# Patient Record
Sex: Female | Born: 1990 | Race: White | Hispanic: No | Marital: Single | Smoking: Former smoker
Health system: Southern US, Community
[De-identification: ages and names within clinical notes are randomized; demographics above are authoritative.]

## PROBLEM LIST (undated history)

## (undated) ENCOUNTER — Inpatient Hospital Stay: Payer: Self-pay

## (undated) DIAGNOSIS — Q428 Congenital absence, atresia and stenosis of other parts of large intestine: Secondary | ICD-10-CM

## (undated) DIAGNOSIS — K219 Gastro-esophageal reflux disease without esophagitis: Secondary | ICD-10-CM

## (undated) DIAGNOSIS — R87629 Unspecified abnormal cytological findings in specimens from vagina: Secondary | ICD-10-CM

## (undated) DIAGNOSIS — F419 Anxiety disorder, unspecified: Secondary | ICD-10-CM

## (undated) DIAGNOSIS — Q423 Congenital absence, atresia and stenosis of anus without fistula: Secondary | ICD-10-CM

## (undated) DIAGNOSIS — J45909 Unspecified asthma, uncomplicated: Secondary | ICD-10-CM

## (undated) DIAGNOSIS — Q421 Congenital absence, atresia and stenosis of rectum without fistula: Secondary | ICD-10-CM

## (undated) HISTORY — PX: ABDOMINAL SURGERY: SHX537

## (undated) HISTORY — PX: OTHER SURGICAL HISTORY: SHX169

---
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
## 2011-02-02 ENCOUNTER — Ambulatory Visit: Payer: Self-pay | Admitting: Gastroenterology

## 2011-02-08 ENCOUNTER — Ambulatory Visit: Payer: Self-pay | Admitting: Gastroenterology

## 2011-03-11 DIAGNOSIS — I059 Rheumatic mitral valve disease, unspecified: Secondary | ICD-10-CM | POA: Insufficient documentation

## 2011-03-11 DIAGNOSIS — F17200 Nicotine dependence, unspecified, uncomplicated: Secondary | ICD-10-CM | POA: Insufficient documentation

## 2011-04-26 ENCOUNTER — Ambulatory Visit: Payer: Self-pay | Admitting: Gastroenterology

## 2011-05-08 ENCOUNTER — Ambulatory Visit: Payer: Self-pay

## 2011-07-24 ENCOUNTER — Emergency Department: Payer: Self-pay | Admitting: *Deleted

## 2011-10-03 DIAGNOSIS — R7989 Other specified abnormal findings of blood chemistry: Secondary | ICD-10-CM | POA: Insufficient documentation

## 2011-10-11 ENCOUNTER — Ambulatory Visit: Payer: Self-pay | Admitting: Gastroenterology

## 2011-10-11 LAB — HCG, QUANTITATIVE, PREGNANCY: Beta Hcg, Quant.: <1 m[IU]/mL — ABNORMAL LOW

## 2011-10-11 LAB — PROTIME-INR
INR: 0.9
Prothrombin Time: 12.9 secs (ref 11.5–14.7)

## 2011-10-11 LAB — APTT: Activated PTT: 27.7 secs (ref 23.6–35.9)

## 2011-10-11 LAB — PLATELET COUNT: Platelet: 213 10*3/uL (ref 150–440)

## 2011-10-13 LAB — PATHOLOGY REPORT

## 2011-12-20 DIAGNOSIS — Q429 Congenital absence, atresia and stenosis of large intestine, part unspecified: Secondary | ICD-10-CM | POA: Insufficient documentation

## 2012-04-03 ENCOUNTER — Ambulatory Visit: Payer: Self-pay | Admitting: Gastroenterology

## 2012-05-22 ENCOUNTER — Ambulatory Visit: Payer: Self-pay | Admitting: Internal Medicine

## 2012-05-22 LAB — RAPID STREP-A WITH REFLX: Micro Text Report: NEGATIVE

## 2012-05-24 LAB — BETA STREP CULTURE(ARMC)

## 2012-08-21 ENCOUNTER — Other Ambulatory Visit: Payer: Self-pay | Admitting: Gastroenterology

## 2012-08-21 LAB — BASIC METABOLIC PANEL
Anion Gap: 3 — ABNORMAL LOW (ref 7–16)
BUN: 11 mg/dL (ref 7–18)
Calcium, Total: 9.2 mg/dL (ref 8.5–10.1)
Chloride: 110 mmol/L — ABNORMAL HIGH (ref 98–107)
Co2: 27 mmol/L (ref 21–32)
Creatinine: 0.92 mg/dL (ref 0.60–1.30)
EGFR (African American): >60
EGFR (Non-African Amer.): >60
Glucose: 81 mg/dL (ref 65–99)
Osmolality: 278 (ref 275–301)
Potassium: 4 mmol/L (ref 3.5–5.1)
Sodium: 140 mmol/L (ref 136–145)

## 2012-08-21 LAB — CBC WITH DIFFERENTIAL/PLATELET
Basophil #: 0 10*3/uL (ref 0.0–0.1)
Basophil %: 0.7 %
Eosinophil #: 0.1 10*3/uL (ref 0.0–0.7)
Eosinophil %: 1 %
HCT: 43.6 % (ref 35.0–47.0)
HGB: 15.6 g/dL (ref 12.0–16.0)
Lymphocyte #: 2 10*3/uL (ref 1.0–3.6)
Lymphocyte %: 30.9 %
MCH: 32.2 pg (ref 26.0–34.0)
MCHC: 35.7 g/dL (ref 32.0–36.0)
MCV: 90 fL (ref 80–100)
Monocyte #: 0.5 x10 3/mm (ref 0.2–0.9)
Monocyte %: 7.3 %
Neutrophil #: 4 10*3/uL (ref 1.4–6.5)
Neutrophil %: 60.1 %
Platelet: 253 10*3/uL (ref 150–440)
RBC: 4.83 10*6/uL (ref 3.80–5.20)
RDW: 12 % (ref 11.5–14.5)
WBC: 6.6 10*3/uL (ref 3.6–11.0)

## 2012-08-21 LAB — HEPATIC FUNCTION PANEL A (ARMC)
Albumin: 4 g/dL (ref 3.4–5.0)
Alkaline Phosphatase: 113 U/L (ref 50–136)
Bilirubin, Direct: 0.1 mg/dL (ref 0.00–0.20)
Bilirubin,Total: 0.8 mg/dL (ref 0.2–1.0)
SGOT(AST): 72 U/L — ABNORMAL HIGH (ref 15–37)
SGPT (ALT): 133 U/L — ABNORMAL HIGH (ref 12–78)
Total Protein: 7.2 g/dL (ref 6.4–8.2)

## 2012-08-21 LAB — PROTIME-INR
INR: 0.8
Prothrombin Time: 11.5 secs (ref 11.5–14.7)

## 2012-08-21 LAB — PREGNANCY, URINE: Pregnancy Test, Urine: NEGATIVE m[IU]/mL

## 2012-08-21 LAB — CLOSTRIDIUM DIFFICILE BY PCR

## 2012-08-21 LAB — WBCS, STOOL

## 2012-08-23 LAB — STOOL CULTURE

## 2013-01-17 ENCOUNTER — Ambulatory Visit: Payer: Self-pay | Admitting: Otolaryngology

## 2013-04-19 ENCOUNTER — Ambulatory Visit: Payer: Self-pay | Admitting: Family Medicine

## 2013-09-22 ENCOUNTER — Ambulatory Visit: Payer: Self-pay | Admitting: Family Medicine

## 2013-09-22 LAB — URINALYSIS, COMPLETE
Bacteria: NEGATIVE
Bilirubin,UR: NEGATIVE
Glucose,UR: NEGATIVE mg/dL (ref 0–75)
Ketone: NEGATIVE
Leukocyte Esterase: NEGATIVE
Nitrite: NEGATIVE
Ph: 6 (ref 4.5–8.0)
Specific Gravity: 1.025 (ref 1.003–1.030)

## 2013-09-22 LAB — PREGNANCY, URINE: Pregnancy Test, Urine: NEGATIVE m[IU]/mL

## 2013-09-22 LAB — WET PREP, GENITAL

## 2013-09-22 LAB — GC/CHLAMYDIA PROBE AMP

## 2013-10-26 ENCOUNTER — Ambulatory Visit: Payer: Self-pay | Admitting: Physician Assistant

## 2013-10-26 LAB — PREGNANCY, URINE: Pregnancy Test, Urine: NEGATIVE m[IU]/mL

## 2013-11-13 DIAGNOSIS — F32A Depression, unspecified: Secondary | ICD-10-CM | POA: Insufficient documentation

## 2013-11-13 DIAGNOSIS — F329 Major depressive disorder, single episode, unspecified: Secondary | ICD-10-CM | POA: Insufficient documentation

## 2014-01-13 ENCOUNTER — Emergency Department: Payer: Self-pay | Admitting: Emergency Medicine

## 2014-02-23 ENCOUNTER — Emergency Department: Payer: Self-pay | Admitting: Emergency Medicine

## 2014-02-23 LAB — URINALYSIS, COMPLETE
Bacteria: NONE SEEN
Bilirubin,UR: NEGATIVE
Blood: NEGATIVE
Glucose,UR: NEGATIVE mg/dL (ref 0–75)
Ketone: NEGATIVE
Leukocyte Esterase: NEGATIVE
Nitrite: NEGATIVE
Ph: 5 (ref 4.5–8.0)
Protein: NEGATIVE
RBC,UR: 1 /HPF (ref 0–5)
Specific Gravity: 1.021 (ref 1.003–1.030)
Squamous Epithelial: 5
WBC UR: 1 /HPF (ref 0–5)

## 2014-02-23 LAB — CBC WITH DIFFERENTIAL/PLATELET
Basophil #: 0 10*3/uL (ref 0.0–0.1)
Basophil %: 0.5 %
Eosinophil #: 0.1 10*3/uL (ref 0.0–0.7)
Eosinophil %: 1.4 %
HCT: 45 % (ref 35.0–47.0)
HGB: 15 g/dL (ref 12.0–16.0)
Lymphocyte #: 2.3 10*3/uL (ref 1.0–3.6)
Lymphocyte %: 29.7 %
MCH: 30.7 pg (ref 26.0–34.0)
MCHC: 33.3 g/dL (ref 32.0–36.0)
MCV: 92 fL (ref 80–100)
Monocyte #: 0.6 x10 3/mm (ref 0.2–0.9)
Monocyte %: 7.7 %
Neutrophil #: 4.7 10*3/uL (ref 1.4–6.5)
Neutrophil %: 60.7 %
Platelet: 201 10*3/uL (ref 150–440)
RBC: 4.87 10*6/uL (ref 3.80–5.20)
RDW: 12.2 % (ref 11.5–14.5)
WBC: 7.8 10*3/uL (ref 3.6–11.0)

## 2014-02-24 LAB — PREGNANCY, URINE: Pregnancy Test, Urine: NEGATIVE m[IU]/mL

## 2014-05-31 ENCOUNTER — Emergency Department: Payer: Self-pay | Admitting: Emergency Medicine

## 2014-06-05 ENCOUNTER — Emergency Department: Payer: Self-pay | Admitting: Emergency Medicine

## 2014-06-05 LAB — URINALYSIS, COMPLETE
Bilirubin,UR: NEGATIVE
Glucose,UR: NEGATIVE mg/dL (ref 0–75)
Ketone: NEGATIVE
Nitrite: NEGATIVE
Ph: 5 (ref 4.5–8.0)
Protein: NEGATIVE
RBC,UR: 14 /HPF (ref 0–5)
Specific Gravity: 1.009 (ref 1.003–1.030)
Squamous Epithelial: NONE SEEN
WBC UR: 52 /HPF (ref 0–5)

## 2014-06-05 LAB — COMPREHENSIVE METABOLIC PANEL
Albumin: 3.9 g/dL (ref 3.4–5.0)
Alkaline Phosphatase: 83 U/L
Anion Gap: 6 — ABNORMAL LOW (ref 7–16)
BUN: 11 mg/dL (ref 7–18)
Bilirubin,Total: 0.4 mg/dL (ref 0.2–1.0)
Calcium, Total: 9.1 mg/dL (ref 8.5–10.1)
Chloride: 108 mmol/L — ABNORMAL HIGH (ref 98–107)
Co2: 25 mmol/L (ref 21–32)
Creatinine: 0.96 mg/dL (ref 0.60–1.30)
EGFR (African American): >60
EGFR (Non-African Amer.): >60
Glucose: 81 mg/dL (ref 65–99)
Osmolality: 276 (ref 275–301)
Potassium: 3.9 mmol/L (ref 3.5–5.1)
SGOT(AST): 18 U/L (ref 15–37)
SGPT (ALT): 18 U/L
Sodium: 139 mmol/L (ref 136–145)
Total Protein: 7.2 g/dL (ref 6.4–8.2)

## 2014-06-05 LAB — CBC
HCT: 45.9 % (ref 35.0–47.0)
HGB: 15.4 g/dL (ref 12.0–16.0)
MCH: 30.3 pg (ref 26.0–34.0)
MCHC: 33.5 g/dL (ref 32.0–36.0)
MCV: 91 fL (ref 80–100)
Platelet: 225 10*3/uL (ref 150–440)
RBC: 5.07 10*6/uL (ref 3.80–5.20)
RDW: 12.2 % (ref 11.5–14.5)
WBC: 12.7 10*3/uL — ABNORMAL HIGH (ref 3.6–11.0)

## 2014-06-07 LAB — URINE CULTURE

## 2014-08-10 ENCOUNTER — Emergency Department: Payer: Self-pay | Admitting: Student

## 2014-08-10 LAB — BASIC METABOLIC PANEL
Anion Gap: 6 — ABNORMAL LOW (ref 7–16)
BUN: 16 mg/dL (ref 7–18)
Calcium, Total: 9 mg/dL (ref 8.5–10.1)
Chloride: 107 mmol/L (ref 98–107)
Co2: 29 mmol/L (ref 21–32)
Creatinine: 1.31 mg/dL — ABNORMAL HIGH (ref 0.60–1.30)
EGFR (African American): >60
EGFR (Non-African Amer.): 54 — ABNORMAL LOW
Glucose: 62 mg/dL — ABNORMAL LOW (ref 65–99)
Osmolality: 282 (ref 275–301)
Potassium: 4 mmol/L (ref 3.5–5.1)
Sodium: 142 mmol/L (ref 136–145)

## 2014-08-10 LAB — CBC
HCT: 45 % (ref 35.0–47.0)
HGB: 15.3 g/dL (ref 12.0–16.0)
MCH: 30.8 pg (ref 26.0–34.0)
MCHC: 34 g/dL (ref 32.0–36.0)
MCV: 91 fL (ref 80–100)
Platelet: 237 10*3/uL (ref 150–440)
RBC: 4.96 10*6/uL (ref 3.80–5.20)
RDW: 12.3 % (ref 11.5–14.5)
WBC: 10.5 10*3/uL (ref 3.6–11.0)

## 2014-10-25 ENCOUNTER — Ambulatory Visit: Payer: Self-pay

## 2014-11-27 ENCOUNTER — Emergency Department: Payer: Self-pay | Admitting: Emergency Medicine

## 2014-11-27 LAB — CBC
HCT: 43.2 % (ref 35.0–47.0)
HGB: 14.8 g/dL (ref 12.0–16.0)
MCH: 31 pg (ref 26.0–34.0)
MCHC: 34.3 g/dL (ref 32.0–36.0)
MCV: 90 fL (ref 80–100)
Platelet: 206 10*3/uL (ref 150–440)
RBC: 4.78 10*6/uL (ref 3.80–5.20)
RDW: 12.4 % (ref 11.5–14.5)
WBC: 7.9 10*3/uL (ref 3.6–11.0)

## 2014-11-27 LAB — COMPREHENSIVE METABOLIC PANEL
Albumin: 4.2 g/dL
Alkaline Phosphatase: 68 U/L
Anion Gap: 8 (ref 7–16)
BUN: 10 mg/dL
Bilirubin,Total: 0.8 mg/dL
Calcium, Total: 9.1 mg/dL
Chloride: 108 mmol/L
Co2: 26 mmol/L
Creatinine: 0.9 mg/dL
EGFR (African American): >60
EGFR (Non-African Amer.): >60
Glucose: 69 mg/dL
Potassium: 3.3 mmol/L — ABNORMAL LOW
SGOT(AST): 19 U/L
SGPT (ALT): 14 U/L
Sodium: 142 mmol/L
Total Protein: 6.8 g/dL

## 2014-11-27 LAB — URINALYSIS, COMPLETE
Bilirubin,UR: NEGATIVE
Glucose,UR: NEGATIVE mg/dL (ref 0–75)
Ketone: NEGATIVE
Nitrite: NEGATIVE
Ph: 7 (ref 4.5–8.0)
Protein: 100
RBC,UR: 1954 /HPF (ref 0–5)
Specific Gravity: 1.018 (ref 1.003–1.030)
Squamous Epithelial: 8
WBC UR: 212 /HPF (ref 0–5)

## 2014-11-27 LAB — WET PREP, GENITAL

## 2014-11-29 LAB — URINE CULTURE

## 2015-03-29 ENCOUNTER — Emergency Department: Payer: No Typology Code available for payment source

## 2015-03-29 ENCOUNTER — Emergency Department
Admission: EM | Admit: 2015-03-29 | Discharge: 2015-03-29 | Disposition: A | Discharge status: Home / Self Care | Payer: No Typology Code available for payment source | Attending: Emergency Medicine | Admitting: Emergency Medicine

## 2015-03-29 ENCOUNTER — Encounter: Payer: Self-pay | Admitting: Emergency Medicine

## 2015-03-29 DIAGNOSIS — Z720 Tobacco use: Secondary | ICD-10-CM | POA: Insufficient documentation

## 2015-03-29 DIAGNOSIS — S63613A Unspecified sprain of left middle finger, initial encounter: Secondary | ICD-10-CM | POA: Diagnosis not present

## 2015-03-29 DIAGNOSIS — S299XXA Unspecified injury of thorax, initial encounter: Secondary | ICD-10-CM | POA: Insufficient documentation

## 2015-03-29 DIAGNOSIS — S6992XA Unspecified injury of left wrist, hand and finger(s), initial encounter: Secondary | ICD-10-CM | POA: Diagnosis present

## 2015-03-29 DIAGNOSIS — S3992XA Unspecified injury of lower back, initial encounter: Secondary | ICD-10-CM | POA: Insufficient documentation

## 2015-03-29 DIAGNOSIS — S63619A Unspecified sprain of unspecified finger, initial encounter: Secondary | ICD-10-CM

## 2015-03-29 DIAGNOSIS — Y998 Other external cause status: Secondary | ICD-10-CM | POA: Insufficient documentation

## 2015-03-29 DIAGNOSIS — Z3202 Encounter for pregnancy test, result negative: Secondary | ICD-10-CM | POA: Diagnosis not present

## 2015-03-29 DIAGNOSIS — Y9389 Activity, other specified: Secondary | ICD-10-CM | POA: Diagnosis not present

## 2015-03-29 DIAGNOSIS — Y92410 Unspecified street and highway as the place of occurrence of the external cause: Secondary | ICD-10-CM | POA: Insufficient documentation

## 2015-03-29 DIAGNOSIS — R0789 Other chest pain: Secondary | ICD-10-CM

## 2015-03-29 LAB — POCT PREGNANCY, URINE: Preg Test, Ur: NEGATIVE

## 2015-03-29 MED ORDER — KETOROLAC TROMETHAMINE 10 MG PO TABS: 10.0000 mg | ORAL_TABLET | Freq: Three times a day (TID) | ORAL | Status: DC

## 2015-03-29 MED ORDER — CYCLOBENZAPRINE HCL 5 MG PO TABS: 5.0000 mg | ORAL_TABLET | Freq: Three times a day (TID) | ORAL | Status: DC | PRN
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
## 2015-04-27 ENCOUNTER — Encounter: Payer: Self-pay | Admitting: Emergency Medicine

## 2015-04-27 ENCOUNTER — Telehealth: Payer: Self-pay | Admitting: Emergency Medicine

## 2015-04-27 ENCOUNTER — Emergency Department
Admission: EM | Admit: 2015-04-27 | Discharge: 2015-04-27 | Disposition: A | Discharge status: Home / Self Care | Payer: Medicaid Other | Attending: Emergency Medicine | Admitting: Emergency Medicine

## 2015-04-27 DIAGNOSIS — B002 Herpesviral gingivostomatitis and pharyngotonsillitis: Secondary | ICD-10-CM

## 2015-04-27 DIAGNOSIS — Z202 Contact with and (suspected) exposure to infections with a predominantly sexual mode of transmission: Secondary | ICD-10-CM | POA: Diagnosis present

## 2015-04-27 DIAGNOSIS — N72 Inflammatory disease of cervix uteri: Secondary | ICD-10-CM | POA: Diagnosis not present

## 2015-04-27 DIAGNOSIS — Z3202 Encounter for pregnancy test, result negative: Secondary | ICD-10-CM | POA: Diagnosis not present

## 2015-04-27 DIAGNOSIS — J069 Acute upper respiratory infection, unspecified: Secondary | ICD-10-CM | POA: Diagnosis not present

## 2015-04-27 DIAGNOSIS — Z720 Tobacco use: Secondary | ICD-10-CM | POA: Diagnosis not present

## 2015-04-27 DIAGNOSIS — B009 Herpesviral infection, unspecified: Secondary | ICD-10-CM | POA: Insufficient documentation

## 2015-04-27 DIAGNOSIS — B373 Candidiasis of vulva and vagina: Secondary | ICD-10-CM | POA: Diagnosis not present

## 2015-04-27 DIAGNOSIS — B3731 Acute candidiasis of vulva and vagina: Secondary | ICD-10-CM

## 2015-04-27 LAB — WET PREP, GENITAL
Clue Cells Wet Prep HPF POC: NONE SEEN
Trich, Wet Prep: NONE SEEN

## 2015-04-27 LAB — URINALYSIS COMPLETE WITH MICROSCOPIC (ARMC ONLY)
Bilirubin Urine: NEGATIVE
Glucose, UA: NEGATIVE mg/dL
Ketones, ur: NEGATIVE mg/dL
Nitrite: NEGATIVE
Protein, ur: 30 mg/dL — AB
Specific Gravity, Urine: 1.028 (ref 1.005–1.030)
pH: 5 (ref 5.0–8.0)

## 2015-04-27 LAB — PREGNANCY, URINE: Preg Test, Ur: NEGATIVE

## 2015-04-27 LAB — CHLAMYDIA/NGC RT PCR (ARMC ONLY)
Chlamydia Tr: DETECTED — AB
N gonorrhoeae: DETECTED — AB

## 2015-04-27 MED ORDER — CEFTRIAXONE SODIUM 1 G IJ SOLR
500.0000 mg | Freq: Once | INTRAMUSCULAR | Status: AC
  Administered 2015-04-27: 500 mg via INTRAMUSCULAR
  Filled 2015-04-27: qty 10

## 2015-04-27 MED ORDER — CIPROFLOXACIN HCL 500 MG PO TABS: 500.0000 mg | ORAL_TABLET | Freq: Two times a day (BID) | ORAL | Status: DC

## 2015-04-27 MED ORDER — MAGIC MOUTHWASH W/LIDOCAINE: 5.0000 mL | Freq: Four times a day (QID) | ORAL | Status: DC | PRN

## 2015-04-27 MED ORDER — FLUCONAZOLE 150 MG PO TABS: 150.0000 mg | ORAL_TABLET | Freq: Every day | ORAL | Status: DC

## 2015-04-27 MED ORDER — AZITHROMYCIN 250 MG PO TABS
1000.0000 mg | ORAL_TABLET | Freq: Once | ORAL | Status: AC
  Administered 2015-04-27: 1000 mg via ORAL
  Filled 2015-04-27: qty 4

## 2015-04-27 NOTE — Discharge Instructions (Signed)
Candidal Vulvovaginitis Candidal vulvovaginitis is an infection of the vagina and vulva. The vulva is the skin around the opening of the vagina. This may cause itching and discomfort in and around the vagina.  HOME CARE  Only take medicine as told by your doctor.  Do not have sex (intercourse) until the infection is healed or as told by your doctor.  Practice safe sex.  Tell your sex partner about your infection.  Do not douche or use tampons.  Wear cotton underwear. Do not wear tight pants or panty hose.  Eat yogurt. This may help treat and prevent yeast infections. GET HELP RIGHT AWAY IF:   You have a fever.  Your problems get worse during treatment or do not get better in 3 days.  You have discomfort, irritation, or itching in your vagina or vulva area.  You have pain after sex.  You start to get belly (abdominal) pain. MAKE SURE YOU:  Understand these instructions.  Will watch your condition.  Will get help right away if you are not doing well or get worse. Document Released: 11/18/2008 Document Revised: 08/27/2013 Document Reviewed: 11/18/2008 [facility] Patient Information 2015 [place], [place]. This information is not intended to replace advice given to you by your health care provider. Make sure you discuss any questions you have with your health care provider.  Cervicitis Cervicitis is a soreness and swelling (inflammation) of the cervix. Your cervix is located at the bottom of your uterus. It opens up to the vagina. CAUSES   Sexually transmitted infections (STIs).   Allergic reaction.   Medicines or birth control devices that are put in the vagina.   Injury to the cervix.   Bacterial infections.  RISK FACTORS You are at greater risk if you:  Have unprotected sexual intercourse.  Have sexual intercourse with many partners.  Began sexual intercourse at an early age.  Have a history of STIs. SYMPTOMS  There may be no symptoms. If symptoms occur,  they may include:   Gray, white, yellow, or bad-smelling vaginal discharge.   Pain or itching of the area outside the vagina.   Painful sexual intercourse.   Lower abdominal or lower back pain, especially during intercourse.   Frequent urination.   Abnormal vaginal bleeding between periods, after sexual intercourse, or after menopause.   Pressure or a heavy feeling in the pelvis.  DIAGNOSIS  Diagnosis is made after a pelvic exam. Other tests may include:   Examination of any discharge under a microscope (wet prep).   A Pap test.  TREATMENT  Treatment will depend on the cause of cervicitis. If it is caused by an STI, both you and your partner will need to be treated. Antibiotic medicines will be given.  HOME CARE INSTRUCTIONS   Do not have sexual intercourse until your health care provider says it is okay.   Do not have sexual intercourse until your partner has been treated, if your cervicitis is caused by an STI.   Take your antibiotics as directed. Finish them even if you start to feel better.  SEEK MEDICAL CARE IF:  Your symptoms come back.   You have a fever.  MAKE SURE YOU:   Understand these instructions.  Will watch your condition.  Will get help right away if you are not doing well or get worse. Document Released: 08/22/2005 Document Revised: 08/27/2013 Document Reviewed: 02/13/2013 [facility] Patient Information 2015 [place], [place]. This information is not intended to replace advice given to you by your health care provider.  Make sure you discuss any questions you have with your health care provider. ° °
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
## 2015-04-27 NOTE — ED Provider Notes (Signed)
[facility] Emergency Department Provider Note  ____________________________________________  Time seen: Approximately 8:57 AM  I have reviewed the triage vital signs and the nursing notes.   HISTORY  Chief Complaint Exposure to STD and URI   HPI Kirsten Wang is a 24 y.o. female who presents to the emergency department for evaluation of cough, blister on her tongue, and vaginal discharge. She states that she has had unprotected intercourse with someone who has gonorrhea. She has had discharge for the past several days. She denies abdominal pain.   History reviewed. No pertinent past medical history.  There are no active problems to display for this patient.   History reviewed. No pertinent past surgical history.  Current Outpatient Rx  Name  Route  Sig  Dispense  Refill  . cyclobenzaprine (FLEXERIL) 5 MG tablet   Oral   Take 1 tablet (5 mg total) by mouth every 8 (eight) hours as needed for muscle spasms.   12 tablet   0   . ketorolac (TORADOL) 10 MG tablet   Oral   Take 1 tablet (10 mg total) by mouth every 8 (eight) hours.   15 tablet   0     Allergies Cephalosporins  No family history on file.  Social History Social History  Substance Use Topics  . Smoking status: Current Every Day Smoker -- 0.50 packs/day    Types: Cigarettes  . Smokeless tobacco: None  . Alcohol Use: Yes     Comment: occas    Review of Systems Constitutional: No fever/chills Cardiovascular: Denies chest pain. Respiratory: Denies shortness of breath or cough. Gastrointestinal: Abdominal pain no., nausea no, vomitingno. Genitourinary: Dysuria no, vaginal discharge yes.. Musculoskeletal: Negative for back pain. Skin: Negative for rash. Neurological: Negative for headaches, focal weakness or numbness.  10-point ROS otherwise negative.  ____________________________________________   PHYSICAL EXAM:  VITAL SIGNS: ED Triage Vitals  Enc Vitals Group   BP 04/27/15 0822 100/61 mmHg     Pulse Rate 04/27/15 0822 87     Resp 04/27/15 0822 18     Temp 04/27/15 0822 97.7 F (36.5 C)     Temp Source 04/27/15 0822 Oral     SpO2 04/27/15 0822 99 %     Weight 04/27/15 0822 125 lb (56.7 kg)     Height 04/27/15 0822  (1.549 m)     Head Cir --      Peak Flow --      Pain Score 04/27/15 0825 8     Pain Loc --      Pain Edu? --      Excl. in GC? --     Constitutional: Alert and oriented. Well appearing and in no acute distress. Eyes: Conjunctivae are normal. PERRL. EOMI. Head: Atraumatic. Nose: No congestion/rhinnorhea. Mouth/Throat: Mucous membranes are moist.  Oropharynx non-erythematous. Neck: No stridor. Cardiovascular: Good peripheral circulation. Respiratory: Normal respiratory effort.  No retractions. Gastrointestinal: Soft and nontender. No distention. No abdominal bruits. Genitourinary: Pelvic exam: External exam normal, yellow discharge noted in vaginal vault, lesions noted at cervical os with yellow/tan discharge noted. Musculoskeletal: No extremity tenderness nor edema.  Neurologic:  Normal speech and language. No gross focal neurologic deficits are appreciated. Speech is normal. No gait instability. Skin:  Skin is warm, dry and intact. No rash noted. Psychiatric: Mood and affect are normal. Speech and behavior are normal.  ____________________________________________   LABS (all labs ordered are listed, but only abnormal results are displayed)  Labs Reviewed  URINALYSIS COMPLETEWITH  MICROSCOPIC ([facility] ONLY)  POC URINE PREG, ED   ____________________________________________  RADIOLOGY  Not indicated ____________________________________________   PROCEDURES  Procedure(s) performed: Pelvic exam see assessment  ____________________________________________   INITIAL IMPRESSION / ASSESSMENT AND PLAN / ED COURSE  Pertinent labs & imaging results that were available during my care of the patient were reviewed by  me and considered in my medical decision making (see chart for details).  IM Rocephin given in the emergency department +1 g of azithromycin.  Patient was strongly advised to follow-up with her gynecologist for evaluation of the cervical lesions.  Patient was advised to follow-up with her primary care provider or the gynecologist for symptoms of concern. She was advised to return to the emergency department if she is unable to schedule an appointment.   ____________________________________________   FINAL CLINICAL IMPRESSION(S) / ED DIAGNOSES  Final diagnoses:  None      Chinita Pester, FNP 04/27/15 1132  Jennye Moccasin, MD 04/27/15 [phone]

## 2015-06-18 ENCOUNTER — Emergency Department
Admission: EM | Admit: 2015-06-18 | Discharge: 2015-06-18 | Disposition: A | Discharge status: Home / Self Care | Payer: No Typology Code available for payment source | Attending: Emergency Medicine | Admitting: Emergency Medicine

## 2015-06-18 ENCOUNTER — Encounter: Payer: Self-pay | Admitting: Emergency Medicine

## 2015-06-18 ENCOUNTER — Emergency Department: Payer: No Typology Code available for payment source

## 2015-06-18 DIAGNOSIS — Z3202 Encounter for pregnancy test, result negative: Secondary | ICD-10-CM | POA: Diagnosis not present

## 2015-06-18 DIAGNOSIS — Y92410 Unspecified street and highway as the place of occurrence of the external cause: Secondary | ICD-10-CM | POA: Diagnosis not present

## 2015-06-18 DIAGNOSIS — Z720 Tobacco use: Secondary | ICD-10-CM | POA: Insufficient documentation

## 2015-06-18 DIAGNOSIS — Y998 Other external cause status: Secondary | ICD-10-CM | POA: Diagnosis not present

## 2015-06-18 DIAGNOSIS — S0990XA Unspecified injury of head, initial encounter: Secondary | ICD-10-CM | POA: Insufficient documentation

## 2015-06-18 DIAGNOSIS — S3991XA Unspecified injury of abdomen, initial encounter: Secondary | ICD-10-CM | POA: Diagnosis not present

## 2015-06-18 DIAGNOSIS — S299XXA Unspecified injury of thorax, initial encounter: Secondary | ICD-10-CM | POA: Diagnosis not present

## 2015-06-18 DIAGNOSIS — S199XXA Unspecified injury of neck, initial encounter: Secondary | ICD-10-CM | POA: Insufficient documentation

## 2015-06-18 DIAGNOSIS — Y9389 Activity, other specified: Secondary | ICD-10-CM | POA: Diagnosis not present

## 2015-06-18 DIAGNOSIS — T1490XA Injury, unspecified, initial encounter: Secondary | ICD-10-CM

## 2015-06-18 DIAGNOSIS — R079 Chest pain, unspecified: Secondary | ICD-10-CM

## 2015-06-18 LAB — TYPE AND SCREEN
ABO/RH(D): O POS
Antibody Screen: NEGATIVE

## 2015-06-18 LAB — ACETAMINOPHEN LEVEL: Acetaminophen (Tylenol), Serum: <10 ug/mL — ABNORMAL LOW (ref 10–30)

## 2015-06-18 LAB — CBC
HCT: 47.5 % — ABNORMAL HIGH (ref 35.0–47.0)
Hemoglobin: 16.3 g/dL — ABNORMAL HIGH (ref 12.0–16.0)
MCH: 31 pg (ref 26.0–34.0)
MCHC: 34.3 g/dL (ref 32.0–36.0)
MCV: 90.3 fL (ref 80.0–100.0)
Platelets: 219 10*3/uL (ref 150–440)
RBC: 5.26 MIL/uL — ABNORMAL HIGH (ref 3.80–5.20)
RDW: 12.3 % (ref 11.5–14.5)
WBC: 13 10*3/uL — ABNORMAL HIGH (ref 3.6–11.0)

## 2015-06-18 LAB — PROTIME-INR
INR: 1.08
Prothrombin Time: 14.2 seconds (ref 11.4–15.0)

## 2015-06-18 LAB — ETHANOL: Alcohol, Ethyl (B): <5 mg/dL (ref <5)

## 2015-06-18 LAB — ABO/RH: ABO/RH(D): O POS

## 2015-06-18 LAB — HCG, QUANTITATIVE, PREGNANCY: hCG, Beta Chain, Quant, S: <1 m[IU]/mL (ref <5)

## 2015-06-18 LAB — BASIC METABOLIC PANEL
Anion gap: 9 (ref 5–15)
BUN: 11 mg/dL (ref 6–20)
CO2: 21 mmol/L — ABNORMAL LOW (ref 22–32)
Calcium: 9.7 mg/dL (ref 8.9–10.3)
Chloride: 109 mmol/L (ref 101–111)
Creatinine, Ser: 1.05 mg/dL — ABNORMAL HIGH (ref 0.44–1.00)
GFR calc Af Amer: >60 mL/min (ref >60)
GFR calc non Af Amer: >60 mL/min (ref >60)
Glucose, Bld: 90 mg/dL (ref 65–99)
Potassium: 4 mmol/L (ref 3.5–5.1)
Sodium: 139 mmol/L (ref 135–145)

## 2015-06-18 LAB — LIPASE, BLOOD: Lipase: 27 U/L (ref 22–51)

## 2015-06-18 MED ORDER — DOCUSATE SODIUM 100 MG PO CAPS: 100.0000 mg | ORAL_CAPSULE | Freq: Every day | ORAL | Status: DC | PRN

## 2015-06-18 MED ORDER — FENTANYL CITRATE (PF) 100 MCG/2ML IJ SOLN
50.0000 ug | Freq: Once | INTRAMUSCULAR | Status: AC
  Administered 2015-06-18: 18:00:00 via INTRAVENOUS
  Filled 2015-06-18: qty 2

## 2015-06-18 MED ORDER — OXYCODONE-ACETAMINOPHEN 5-325 MG PO TABS
1.0000 | ORAL_TABLET | Freq: Once | ORAL | Status: AC
  Administered 2015-06-18: 1 via ORAL
  Filled 2015-06-18: qty 1

## 2015-06-18 MED ORDER — OXYCODONE-ACETAMINOPHEN 5-325 MG PO TABS: 1.0000 | ORAL_TABLET | Freq: Four times a day (QID) | ORAL | Status: DC | PRN

## 2015-06-18 MED ORDER — IOHEXOL 300 MG/ML  SOLN
100.0000 mL | Freq: Once | INTRAMUSCULAR | Status: AC | PRN
  Administered 2015-06-18: 100 mL via INTRAVENOUS
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
## 2015-06-18 NOTE — ED Notes (Signed)
Pt ambulated around A and B side, Pt maintained SPO2 between 97-100%. Pt denies lightheadedness upon ambulation.
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
## 2015-06-18 NOTE — ED Notes (Signed)
Pt c/o left rib cage pain and head pain.

## 2015-06-18 NOTE — Discharge Instructions (Signed)
Chest Wall Pain °Chest wall pain is pain in or around the bones and muscles of your chest. Sometimes, an injury causes this pain. Sometimes, the cause may not be known. This pain may take several weeks or longer to get better. °HOME CARE °Pay attention to any changes in your symptoms. Take these actions to help with your pain: °· Rest as told by your doctor. °· Avoid activities that cause pain. Try not to use your chest, belly (abdominal), or side muscles to lift heavy things. °· If directed, apply ice to the painful area: °¨ Put ice in a plastic bag. °¨ Place a towel between your skin and the bag. °¨ Leave the ice on for 20 minutes, 2-3 times per day. °· Take over-the-counter and prescription medicines only as told by your doctor. °· Do not use tobacco products, including cigarettes, chewing tobacco, and e-cigarettes. If you need help quitting, ask your doctor. °· Keep all follow-up visits as told by your doctor. This is important. °GET HELP IF: °· You have a fever. °· Your chest pain gets worse. °· You have new symptoms. °GET HELP RIGHT AWAY IF: °· You feel sick to your stomach (nauseous) or you throw up (vomit). °· You feel sweaty or light-headed. °· You have a cough with phlegm (sputum) or you cough up blood. °· You are short of breath. °  °This information is not intended to replace advice given to you by your health care provider. Make sure you discuss any questions you have with your health care provider. °  °Document Released: 02/08/2008 Document Revised: 05/13/2015 Document Reviewed: 11/17/2014 °Elsevier Interactive Patient Education ©2016 [employer] ° °

## 2015-06-29 ENCOUNTER — Emergency Department
Admission: EM | Admit: 2015-06-29 | Discharge: 2015-06-29 | Disposition: A | Discharge status: Home / Self Care | Payer: No Typology Code available for payment source | Attending: Emergency Medicine | Admitting: Emergency Medicine

## 2015-06-29 ENCOUNTER — Emergency Department: Payer: No Typology Code available for payment source

## 2015-06-29 ENCOUNTER — Encounter: Payer: Self-pay | Admitting: Emergency Medicine

## 2015-06-29 DIAGNOSIS — Z720 Tobacco use: Secondary | ICD-10-CM | POA: Insufficient documentation

## 2015-06-29 DIAGNOSIS — R0781 Pleurodynia: Secondary | ICD-10-CM

## 2015-06-29 MED ORDER — ETODOLAC 400 MG PO TABS: 400.0000 mg | ORAL_TABLET | Freq: Two times a day (BID) | ORAL | Status: DC
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
## 2015-09-10 ENCOUNTER — Ambulatory Visit
Admission: EM | Admit: 2015-09-10 | Discharge: 2015-09-10 | Disposition: A | Discharge status: Home / Self Care | Payer: Medicaid Other | Attending: Family Medicine | Admitting: Family Medicine

## 2015-09-10 ENCOUNTER — Ambulatory Visit
Admit: 2015-09-10 | Discharge: 2015-09-10 | Disposition: A | Discharge status: Home / Self Care | Payer: Medicaid Other | Attending: Family Medicine | Admitting: Family Medicine

## 2015-09-10 DIAGNOSIS — N760 Acute vaginitis: Secondary | ICD-10-CM

## 2015-09-10 DIAGNOSIS — R102 Pelvic and perineal pain: Secondary | ICD-10-CM | POA: Diagnosis not present

## 2015-09-10 DIAGNOSIS — R109 Unspecified abdominal pain: Secondary | ICD-10-CM

## 2015-09-10 DIAGNOSIS — N730 Acute parametritis and pelvic cellulitis: Secondary | ICD-10-CM | POA: Diagnosis not present

## 2015-09-10 DIAGNOSIS — R100 Acute abdomen: Secondary | ICD-10-CM | POA: Diagnosis not present

## 2015-09-10 LAB — URINALYSIS COMPLETE WITH MICROSCOPIC (ARMC ONLY)
Bacteria, UA: NONE SEEN
Bilirubin Urine: NEGATIVE
Glucose, UA: NEGATIVE mg/dL
Hgb urine dipstick: NEGATIVE
Ketones, ur: NEGATIVE mg/dL
Leukocytes, UA: NEGATIVE
Nitrite: NEGATIVE
Protein, ur: NEGATIVE mg/dL
RBC / HPF: NONE SEEN RBC/hpf (ref 0–5)
Specific Gravity, Urine: 1.03 (ref 1.005–1.030)
pH: 5.5 (ref 5.0–8.0)

## 2015-09-10 LAB — CHLAMYDIA/NGC RT PCR (ARMC ONLY)
Chlamydia Tr: NOT DETECTED
N gonorrhoeae: NOT DETECTED

## 2015-09-10 LAB — WET PREP, GENITAL
Clue Cells Wet Prep HPF POC: NONE SEEN
Sperm: NONE SEEN
Trich, Wet Prep: NONE SEEN
Yeast Wet Prep HPF POC: NONE SEEN

## 2015-09-10 LAB — PREGNANCY, URINE: Preg Test, Ur: NEGATIVE

## 2015-09-10 MED ORDER — METRONIDAZOLE 500 MG PO TABS: 500.0000 mg | ORAL_TABLET | Freq: Two times a day (BID) | ORAL | Status: DC

## 2015-09-10 MED ORDER — DOXYCYCLINE HYCLATE 100 MG PO TABS: 100.0000 mg | ORAL_TABLET | Freq: Two times a day (BID) | ORAL | Status: DC

## 2015-09-10 MED ORDER — CEFTRIAXONE SODIUM 1 G IJ SOLR
1.0000 g | Freq: Once | INTRAMUSCULAR | Status: AC
  Administered 2015-09-10: 1 g via INTRAMUSCULAR

## 2015-09-10 MED ORDER — HYDROCODONE-ACETAMINOPHEN 5-325 MG PO TABS: 1.0000 | ORAL_TABLET | Freq: Three times a day (TID) | ORAL | Status: DC | PRN

## 2015-09-10 NOTE — ED Provider Notes (Addendum)
CSN: [id number]     Arrival date & time 09/10/15  0844 History   First MD Initiated Contact with Patient 09/10/15 [phone]    Nurses notes were reviewed. Chief Complaint  Patient presents with  . Abdominal Pain   Patient is a gravida 2 para 1 miscarriage 1 white female with abdominal pain for 2 days. She states she was sexually active about 3 days ago has some dyspareunia but the pain has continued to get worse since then. No nausea vomiting. The pain is in the mid lower abdomen and the pelvic area. She initially denied a discharge but did report having a thick white vaginal secretion which she can now admit is a discharge. She states she was treated for media by the health department sometime in 2016 and rechecked and cleared. She denies ever having a pelvic infection before or ovarian cyst. She comes in requesting a pelvic exam worried that the pain is being caused by her GYN system.   She does smoke.  No pertinent family medical history in relationship to her pelvic problems.  (Consider location/radiation/quality/duration/timing/severity/associated sxs/prior Treatment) Patient is a 25 y.o. female presenting with abdominal pain. The history is provided by the patient.  Abdominal Pain Pain location:  Suprapubic Pain quality: cramping, fullness, sharp, shooting and stabbing   Pain radiates to:  Suprapubic region Pain severity:  Moderate Onset quality:  Sudden Timing:  Constant Progression:  Worsening Context: recent sexual activity   Context: not awakening from sleep, not diet changes, not eating and not retching   Relieved by:  Nothing Ineffective treatments:  None tried Associated symptoms: vaginal discharge   Associated symptoms: no nausea   Risk factors: not elderly, has not had multiple surgeries, not obese, not pregnant and no recent hospitalization     History reviewed. No pertinent past medical history. Past Surgical History  Procedure Laterality Date  . Abdominal surgery    .  Klonicatresia     History reviewed. No pertinent family history. Social History  Substance Use Topics  . Smoking status: Current Every Day Smoker -- 0.50 packs/day    Types: Cigarettes  . Smokeless tobacco: None  . Alcohol Use: Yes     Comment: occas   OB History    No data available     Review of Systems  Gastrointestinal: Positive for abdominal pain. Negative for nausea, blood in stool, abdominal distention and rectal pain.  Genitourinary: Positive for vaginal discharge, vaginal pain, pelvic pain and dyspareunia.  All other systems reviewed and are negative.   Allergies  Cephalosporins  Home Medications   Prior to Admission medications   Medication Sig Start Date End Date Taking? Authorizing Provider  docusate sodium (COLACE) 100 MG capsule Take 1 capsule (100 mg total) by mouth daily as needed. 06/18/15 06/17/16  Jeanmarie Plant, MD  doxycycline (VIBRA-TABS) 100 MG tablet Take 1 tablet (100 mg total) by mouth 2 (two) times daily. 09/10/15   Hassan Rowan, MD  etodolac (LODINE) 400 MG tablet Take 1 tablet (400 mg total) by mouth 2 (two) times daily. 06/29/15   Tommi Rumps, PA-C  metroNIDAZOLE (FLAGYL) 500 MG tablet Take 1 tablet (500 mg total) by mouth 2 (two) times daily. 09/10/15   Hassan Rowan, MD  oxyCODONE-acetaminophen (ROXICET) 5-325 MG tablet Take 1 tablet by mouth every 6 (six) hours as needed. 06/18/15   Jeanmarie Plant, MD   Meds Ordered and Administered this Visit   Medications  cefTRIAXone (ROCEPHIN) injection 1 g (1 g Intramuscular Given  09/10/15 1000)    BP 111/66 mmHg  Pulse 95  Temp(Src) 97.4 F (36.3 C) (Oral)  Resp 16  Ht 5\' 1"  (1.549 m)  Wt 130 lb (58.968 kg)  BMI 24.58 kg/m2  SpO2 97%  LMP 08/21/2015 (Approximate) No data found.   Physical Exam  Constitutional: She is oriented to person, place, and time. She appears well-developed and well-nourished.  HENT:  Head: Normocephalic and atraumatic.  Eyes: Conjunctivae are normal. Pupils are  equal, round, and reactive to light.  Neck: Neck supple.  Abdominal: Bowel sounds are normal. She exhibits no distension. There is no tenderness. There is no guarding.  Genitourinary: Rectum normal. Rectal exam shows no fissure. There is no rash or tenderness on the right labia. Uterus is tender. Cervix exhibits discharge. Right adnexum displays tenderness and fullness. Left adnexum displays tenderness. Left adnexum displays no fullness. There is tenderness in the vagina. No foreign body around the vagina. Vaginal discharge found.  Patient had marked tenderness in the position of the cervix and uterus. Heavy thick whitish greenish discharge present as well. Examination confirms tenderness over the uterus as well  Musculoskeletal: Normal range of motion. She exhibits no edema.  Neurological: She is alert and oriented to person, place, and time.  Skin: Skin is warm and dry.  Psychiatric: She has a normal mood and affect.  Vitals reviewed.   ED Course  Procedures (including critical care time)  Labs Review Labs Reviewed  URINALYSIS COMPLETEWITH MICROSCOPIC ([facility] ONLY) - Abnormal; Notable for the following:    Squamous Epithelial / LPF 0-5 (*)    All other components within normal limits  URINE CULTURE  WET PREP, GENITAL  CHLAMYDIA/NGC RT PCR ([facility] ONLY)  PREGNANCY, URINE    Imaging Review No results found.   Visual Acuity Review  Right Eye Distance:   Left Eye Distance:   Bilateral Distance:    Right Eye Near:   Left Eye Near:    Bilateral Near:      Results for orders placed or performed during the hospital encounter of 09/10/15  Wet prep, genital  Result Value Ref Range   Yeast Wet Prep HPF POC NONE SEEN NONE SEEN   Trich, Wet Prep NONE SEEN NONE SEEN   Clue Cells Wet Prep HPF POC NONE SEEN NONE SEEN   WBC, Wet Prep HPF POC MANY (A) NONE SEEN   Sperm NONE SEEN   Urinalysis complete, with microscopic  Result Value Ref Range   Color, Urine YELLOW YELLOW    APPearance CLEAR CLEAR   Glucose, UA NEGATIVE NEGATIVE mg/dL   Bilirubin Urine NEGATIVE NEGATIVE   Ketones, ur NEGATIVE NEGATIVE mg/dL   Specific Gravity, Urine 1.030 1.005 - 1.030   Hgb urine dipstick NEGATIVE NEGATIVE   pH 5.5 5.0 - 8.0   Protein, ur NEGATIVE NEGATIVE mg/dL   Nitrite NEGATIVE NEGATIVE   Leukocytes, UA NEGATIVE NEGATIVE   RBC / HPF NONE SEEN 0 - 5 RBC/hpf   WBC, UA 0-5 0 - 5 WBC/hpf   Bacteria, UA NONE SEEN NONE SEEN   Squamous Epithelial / LPF 0-5 (A) NONE SEEN   Mucous PRESENT    Ca Oxalate Crys, UA PRESENT   Pregnancy, urine  Result Value Ref Range   Preg Test, Ur NEGATIVE NEGATIVE     MDM   1. PID (acute pelvic inflammatory disease)   2. Vaginitis   3. Abdominal pain, acute    Plans are to send for ultrasound of the pelvis to rule out ovarian  cyst but will give a gram of Rocephin IM. She is given a shot of Rocephin at the health department so even though she has allergy to cephlasporsin.  Recommend pelvic rest for 2 weeks and recheck of infection by PCP of choice and warned her that she needs to be careful because repeat PID can cause sterilization and scarring should also be noted offered patient do HIV testing and RPR testing for syphilis but she declines states she's had it done at the health department recently.  Hassan RowanEugene Deaja Rizo, MD 09/10/15 1102  Hassan RowanEugene Anton Cheramie, MD 09/10/15 [phone]

## 2015-09-10 NOTE — Discharge Instructions (Signed)
Bacterial Vaginosis Bacterial vaginosis is an infection of the vagina. It happens when too many germs (bacteria) grow in the vagina. Having this infection puts you at risk for getting other infections from sex. Treating this infection can help lower your risk for other infections, such as:   Chlamydia.  Gonorrhea.  HIV.  Herpes. HOME CARE  Take your medicine as told by your doctor.  Finish your medicine even if you start to feel better.  Tell your sex partner that you have an infection. They should see their doctor for treatment.  During treatment:  Avoid sex or use condoms correctly.  Do not douche.  Do not drink alcohol unless your doctor tells you it is ok.  Do not breastfeed unless your doctor tells you it is ok. GET HELP IF:  You are not getting better after 3 days of treatment.  You have more grey fluid (discharge) coming from your vagina than before.  You have more pain than before.  You have a fever. MAKE SURE YOU:   Understand these instructions.  Will watch your condition.  Will get help right away if you are not doing well or get worse.   This information is not intended to replace advice given to you by your health care provider. Make sure you discuss any questions you have with your health care provider.   Document Released: 05/31/2008 Document Revised: 09/12/2014 Document Reviewed: 04/03/2013 Elsevier Interactive Patient Education 2016 Elsevier Inc.  Pelvic Inflammatory Disease Pelvic inflammatory disease (PID) is an infection in some or all of the female organs. PID can be in the uterus, ovaries, fallopian tubes, or the surrounding tissues that are inside the lower belly area (pelvis). PID can lead to lasting problems if it is not treated. To check for this disease, your doctor may:  Do a physical exam.  Do blood tests, urine tests, or a pregnancy test.  Look at your vaginal discharge.  Do tests to look inside the pelvis.  Test you for other  infections. HOME CARE  Take over-the-counter and prescription medicines only as told by your doctor.  If you were prescribed an antibiotic medicine, take it as told by your doctor. Do not stop taking it even if you start to feel better.  Do not have sex until treatment is done or as told by your doctor.  Tell your sex partner if you have PID. Your partner may need to be treated.  Keep all follow-up visits as told by your doctor. This is important.  Your doctor may test you for infection again 3 months after you are treated. GET HELP IF:  You have more fluid (discharge) coming from your vagina or fluid that is not normal.  Your pain does not improve.  You throw up (vomit).  You have a fever.  You cannot take your medicines.  Your partner has a sexually transmitted disease (STD).  You have pain when you pee (urinate). GET HELP RIGHT AWAY IF:  You have more belly (abdominal) or lower belly pain.  You have chills.  You are not better after 72 hours.   This information is not intended to replace advice given to you by your health care provider. Make sure you discuss any questions you have with your health care provider.   Document Released: 11/18/2008 Document Revised: 05/13/2015 Document Reviewed: 09/29/2014 Elsevier Interactive Patient Education 2016 [employer].  Vaginitis Vaginitis is an inflammation of the vagina. It can happen when the normal bacteria and yeast in the vagina  grow too much. There are different types. Treatment will depend on the type you have. HOME CARE  Take all medicines as told by your doctor.  Keep your vagina area clean and dry. Avoid soap. Rinse the area with water.  Avoid washing and cleaning out the vagina (douching).  Do not use tampons or have sex (intercourse) until your treatment is done.  Wipe from front to back after going to the restroom.  Wear cotton underwear.  Avoid wearing underwear while you sleep until your vaginitis is  gone.  Avoid tight pants. Avoid underwear or nylons without a cotton panel.  Take off wet clothing (such as a bathing suit) as soon as you can.  Use mild, unscented products. Avoid fabric softeners and scented:  Feminine sprays.  Laundry detergents.  Tampons.  Soaps or bubble baths.  Practice safe sex and use condoms. GET HELP RIGHT AWAY IF:   You have belly (abdominal) pain.  You have a fever or lasting symptoms for more than 2-3 days.  You have a fever and your symptoms suddenly get worse. MAKE SURE YOU:   Understand these instructions.  Will watch this condition.  Will get help right away if you are not doing well or get worse.   This information is not intended to replace advice given to you by your health care provider. Make sure you discuss any questions you have with your health care provider.   Document Released: 11/18/2008 Document Revised: 05/16/2012 Document Reviewed: 02/02/2012 Elsevier Interactive Patient Education [employer]2016 Elsevier Inc.

## 2015-09-10 NOTE — ED Notes (Addendum)
C/o low abdominal pain x 2 days. "I think I'm constipated". Saw PMD 3 weeks ago and told "was okay". Requesting a pelvic exam today

## 2015-09-10 NOTE — ED Notes (Signed)
Still c/o 8/10 low abdominal pain and requesting pain medication. No noted reaction to Rocephin IM. Water given for STAT Ultrasound as ordered and instructed not to void. Rx given for pain medications and instructions to go now to Radiology on [address] [place]

## 2015-09-11 ENCOUNTER — Telehealth: Payer: Self-pay

## 2015-09-11 NOTE — ED Notes (Signed)
Patient called requesting antibiotics be called that was ordered yesterday, to be called into [facility] [place]. Also states "I didn't get no prescription for pain medicine and need that called in too". Informed that cannot call in pain medication and that Dr. Thurmond ButtsWade will not rewrite a prescription for Norco. This nurse handed printed Rx for Norco directly to the patient yesterday immediately on discharge.. Prescriptions for Flagyl 500 mg. Take 1 po bid x 10 days. #20. No refills AND Doxycycline 100 mg. Take 1 po bid x 10 days. #20. [facility] [place]

## 2015-09-12 LAB — URINE CULTURE
Culture: 10000
Special Requests: NORMAL

## 2015-09-14 ENCOUNTER — Telehealth: Payer: Self-pay | Admitting: *Deleted

## 2015-09-14 NOTE — ED Notes (Signed)
Returned patient call. Patient reports that she was unable to pick up her prescription for Flagyl and [facility] [place]. I called and confirmed with [facility] that the prescription for Flagyl had been called in and was on file. The pharmacist did state that the prescription had been filled elsewhere and would not be available for pick up for another three days.  I will attempt to contact the patient to inform her that her prescription will be available in 3 days at the [facility].

## 2016-02-15 ENCOUNTER — Other Ambulatory Visit: Payer: Self-pay | Admitting: Family Medicine

## 2016-02-15 DIAGNOSIS — N912 Amenorrhea, unspecified: Secondary | ICD-10-CM

## 2016-02-18 ENCOUNTER — Ambulatory Visit
Admission: RE | Admit: 2016-02-18 | Discharge: 2016-02-18 | Disposition: A | Discharge status: Home / Self Care | Payer: Medicaid Other | Source: Ambulatory Visit | Attending: Family Medicine | Admitting: Family Medicine

## 2016-02-18 ENCOUNTER — Other Ambulatory Visit: Payer: Self-pay | Admitting: Family Medicine

## 2016-02-18 DIAGNOSIS — O30041 Twin pregnancy, dichorionic/diamniotic, first trimester: Secondary | ICD-10-CM | POA: Diagnosis not present

## 2016-02-18 DIAGNOSIS — Z3A01 Less than 8 weeks gestation of pregnancy: Secondary | ICD-10-CM | POA: Diagnosis not present

## 2016-02-18 DIAGNOSIS — N912 Amenorrhea, unspecified: Secondary | ICD-10-CM | POA: Diagnosis not present

## 2016-02-18 DIAGNOSIS — Z3201 Encounter for pregnancy test, result positive: Secondary | ICD-10-CM | POA: Diagnosis present

## 2016-03-31 ENCOUNTER — Emergency Department
Admission: EM | Admit: 2016-03-31 | Discharge: 2016-03-31 | Disposition: A | Discharge status: Home / Self Care | Payer: Medicaid Other | Attending: Emergency Medicine | Admitting: Emergency Medicine

## 2016-03-31 DIAGNOSIS — Z3A12 12 weeks gestation of pregnancy: Secondary | ICD-10-CM | POA: Insufficient documentation

## 2016-03-31 DIAGNOSIS — R102 Pelvic and perineal pain: Secondary | ICD-10-CM | POA: Insufficient documentation

## 2016-03-31 DIAGNOSIS — O26891 Other specified pregnancy related conditions, first trimester: Secondary | ICD-10-CM | POA: Insufficient documentation

## 2016-03-31 DIAGNOSIS — F17210 Nicotine dependence, cigarettes, uncomplicated: Secondary | ICD-10-CM | POA: Diagnosis not present

## 2016-03-31 DIAGNOSIS — O99331 Smoking (tobacco) complicating pregnancy, first trimester: Secondary | ICD-10-CM | POA: Insufficient documentation

## 2016-03-31 DIAGNOSIS — O200 Threatened abortion: Secondary | ICD-10-CM

## 2016-03-31 LAB — CBC WITH DIFFERENTIAL/PLATELET
Basophils Absolute: 0 10*3/uL (ref 0–0.1)
Basophils Relative: 0 %
Eosinophils Absolute: 0 10*3/uL (ref 0–0.7)
Eosinophils Relative: 0 %
HCT: 40.1 % (ref 35.0–47.0)
Hemoglobin: 14 g/dL (ref 12.0–16.0)
Lymphocytes Relative: 15 %
Lymphs Abs: 1.9 10*3/uL (ref 1.0–3.6)
MCH: 31.4 pg (ref 26.0–34.0)
MCHC: 35 g/dL (ref 32.0–36.0)
MCV: 89.6 fL (ref 80.0–100.0)
Monocytes Absolute: 0.6 10*3/uL (ref 0.2–0.9)
Monocytes Relative: 5 %
Neutro Abs: 10.2 10*3/uL — ABNORMAL HIGH (ref 1.4–6.5)
Neutrophils Relative %: 80 %
Platelets: 199 10*3/uL (ref 150–440)
RBC: 4.47 MIL/uL (ref 3.80–5.20)
RDW: 12.5 % (ref 11.5–14.5)
WBC: 12.8 10*3/uL — ABNORMAL HIGH (ref 3.6–11.0)

## 2016-03-31 LAB — URINALYSIS COMPLETE WITH MICROSCOPIC (ARMC ONLY)
Bacteria, UA: NONE SEEN
Bilirubin Urine: NEGATIVE
Glucose, UA: NEGATIVE mg/dL
Hgb urine dipstick: NEGATIVE
Ketones, ur: NEGATIVE mg/dL
Leukocytes, UA: NEGATIVE
Nitrite: NEGATIVE
Protein, ur: NEGATIVE mg/dL
RBC / HPF: NONE SEEN RBC/hpf (ref 0–5)
Specific Gravity, Urine: 1.012 (ref 1.005–1.030)
pH: 7 (ref 5.0–8.0)

## 2016-03-31 LAB — WET PREP, GENITAL
Clue Cells Wet Prep HPF POC: NONE SEEN
Sperm: NONE SEEN
Trich, Wet Prep: NONE SEEN
Yeast Wet Prep HPF POC: NONE SEEN

## 2016-03-31 LAB — POCT PREGNANCY, URINE: Preg Test, Ur: POSITIVE — AB

## 2016-03-31 LAB — CHLAMYDIA/NGC RT PCR (ARMC ONLY)
Chlamydia Tr: NOT DETECTED
N gonorrhoeae: NOT DETECTED

## 2016-03-31 LAB — HCG, QUANTITATIVE, PREGNANCY: hCG, Beta Chain, Quant, S: 147158 m[IU]/mL — ABNORMAL HIGH (ref <5)

## 2016-03-31 LAB — ABO/RH: ABO/RH(D): O POS

## 2016-03-31 MED ORDER — SODIUM CHLORIDE 0.9 % IV BOLUS (SEPSIS)
1000.0000 mL | Freq: Once | INTRAVENOUS | Status: AC
  Administered 2016-03-31: 1000 mL via INTRAVENOUS

## 2016-03-31 NOTE — ED Notes (Signed)
NAD noted at time of D/C. Pt denies questions or concerns. Pt ambulatory to the lobby at this time.  

## 2016-03-31 NOTE — ED Notes (Signed)
Fetal heart tones obtained by ultrasound/Dr McShane - Baby A 150 - Baby B 140

## 2016-03-31 NOTE — ED Triage Notes (Signed)
Pt states she is approximately [redacted] weeks pregnant with twins and for the past 2 days is having groin/pelvic pain with discharge.. Denies any bleeding , OB is [facility].

## 2016-03-31 NOTE — ED Provider Notes (Addendum)
[facility] Emergency Department Provider Note  ____________________________________________   I have reviewed the triage vital signs and the nursing notes.   HISTORY  Chief Complaint Abdominal Pain and Routine Prenatal Visit    HPI Kirsten Wang is a 25 y.o. female who is G2 P1 who is known to be approximately [redacted] weeks pregnant with twins by prior ultrasound. Patient presents today with a occasional cramping discomfort. She also has a whitish vaginal discharge. She denies of STI exposure. She is doing the same partner for a long time. She feels this is a yeast infection, which she has had before. She denies any vaginal bleeding. She denies any nausea or vomiting. She is not having any pain at this moment. She states occasionally she has a cramping pain. It is not rhythmic or talkative contractions. She denies dysuria or urinary frequency. She states "I'm just freaking out to make sure my babies are okay".     History reviewed. No pertinent past medical history.  There are no active problems to display for this patient.   Past Surgical History:  Procedure Laterality Date  . ABDOMINAL SURGERY    . klonicatresia      Current Outpatient Rx  . Order #: [id number] Class: Print  . Order #: [id number] Class: Print  . Order #: [id number] Class: Print  . Order #: [id number] Class: Normal  . Order #: [id number] Class: Print  . Order #: [id number] Class: Print    Allergies Cephalosporins  No family history on file.  Social History Social History  Substance Use Topics  . Smoking status: Current Every Day Smoker    Packs/day: 0.50    Types: Cigarettes  . Smokeless tobacco: Never Used  . Alcohol use No     Comment: occas    Review of Systems Constitutional: No fever/chills Eyes: No visual changes. ENT: No sore throat. No stiff neck no neck pain Cardiovascular: Denies chest pain. Respiratory: Denies shortness of breath. Gastrointestinal:   no vomiting.  No  diarrhea.  No constipation. Genitourinary: Negative for dysuria. Musculoskeletal: Negative lower extremity swelling Skin: Negative for rash. Neurological: Negative for severe headaches, focal weakness or numbness. 10-point ROS otherwise negative.  ____________________________________________   PHYSICAL EXAM:  VITAL SIGNS: ED Triage Vitals  Enc Vitals Group     BP 03/31/16 1352 116/77     Pulse --      Resp 03/31/16 1352 18     Temp 03/31/16 1352 98.2 F (36.8 C)     Temp Source 03/31/16 1352 Oral     SpO2 03/31/16 1352 100 %     Weight 03/31/16 1352 130 lb (59 kg)     Height 03/31/16 1352  (1.549 m)     Head Circumference --      Peak Flow --      Pain Score 03/31/16 1400 5     Pain Loc --      Pain Edu? --      Excl. in GC? --     Constitutional: Alert and oriented. Well appearing and in no acute distress. Eyes: Conjunctivae are normal. PERRL. EOMI. Head: Atraumatic. Nose: No congestion/rhinnorhea. Mouth/Throat: Mucous membranes are moist.  Oropharynx non-erythematous. Neck: No stridor.   Nontender with no meningismus Cardiovascular: Normal rate, regular rhythm. Grossly normal heart sounds.  Good peripheral circulation. Respiratory: Normal respiratory effort.  No retractions. Lungs CTAB. Abdominal: Soft and nontender. No distention. No guarding no rebound Back:  There is no focal tenderness or step off.  there is no midline tenderness  there are no lesions noted. there is no CVA tenderness Pelvic exam: Female nurse chaperone present, no external lesions noted, scant whitish vaginal discharge noted with no purulent discharge, no cervical motion tenderness, no adnexal tenderness or mass, there is no significant uterine tenderness.. No vaginal bleeding Musculoskeletal: No lower extremity tenderness, no upper extremity tenderness. No joint effusions, no DVT signs strong distal pulses no edema Neurologic:  Normal speech and language. No gross focal neurologic deficits are  appreciated.  Skin:  Skin is warm, dry and intact. No rash noted. Psychiatric: Mood and affect are normal. Speech and behavior are normal.  ____________________________________________   LABS (all labs ordered are listed, but only abnormal results are displayed)  Labs Reviewed  URINALYSIS COMPLETEWITH MICROSCOPIC ([facility] ONLY) - Abnormal; Notable for the following:       Result Value   Color, Urine STRAW (*)    APPearance CLEAR (*)    Squamous Epithelial / LPF 0-5 (*)    All other components within normal limits  CBC WITH DIFFERENTIAL/PLATELET - Abnormal; Notable for the following:    WBC 12.8 (*)    Neutro Abs 10.2 (*)    All other components within normal limits  CHLAMYDIA/NGC RT PCR ([facility] ONLY)  WET PREP, GENITAL  HCG, QUANTITATIVE, PREGNANCY  POC URINE PREG, ED  ABO/RH   ____________________________________________  EKG  I personally interpreted any EKGs ordered by me or triage  ____________________________________________  RADIOLOGY  I reviewed any imaging ordered by me or triage that were performed during my shift and, if possible, patient and/or family made aware of any abnormal findings. ____________________________________________   PROCEDURES  Procedure(s) performed: None  Procedures  Critical Care performed: None  ____________________________________________   INITIAL IMPRESSION / ASSESSMENT AND PLAN / ED COURSE  Pertinent labs & imaging results that were available during my care of the patient were reviewed by me and considered in my medical decision making (see chart for details).  Patient here is anxiety about her pregnancy. Bedside ultrasound, we'll not sufficient for analysis of anatomy, does show fetal heart tones at 140 and 150 respectively. This is very reassuring to the patient. She has a known IUP by prior ultrasound. There is no vaginal bleeding. Os is closed, cervix appears to be normal, there is no evidence therefore impending  miscarriage and even if there were, patient is previable. Patient is very reassured by this. Blood work is reassuring. We will discuss with her OB and hopefully get her home safely.  Clinical Course  ----------------------------------------- 5:00 PM on 03/31/2016 -----------------------------------------  Dr. Tiburcio Pea is in the [facility], they will call back. Patient has no further complaints and now that she is seen fetal heart tones she is reassured. We will discharge her with close outpatient follow-up with her OB/GYN tomorrow. Return precautions given and understood. At this time there is nothing to suggest other abdominal pathology such as appendicitis or cholecystitis etc. Nonetheless return precautions are understood. ____________________________________________   FINAL CLINICAL IMPRESSION(S) / ED DIAGNOSES  Final diagnoses:  None      This chart was dictated using voice recognition software.  Despite best efforts to proofread,  errors can occur which can change meaning.      Jeanmarie Plant, MD 03/31/16 5009    Jeanmarie Plant, MD 03/31/16 1700

## 2016-05-17 ENCOUNTER — Observation Stay
Admission: EM | Admit: 2016-05-17 | Discharge: 2016-05-17 | Disposition: A | Discharge status: Home / Self Care | Payer: Medicaid Other | Attending: Obstetrics & Gynecology | Admitting: Obstetrics & Gynecology

## 2016-05-17 ENCOUNTER — Emergency Department: Payer: Medicaid Other

## 2016-05-17 ENCOUNTER — Encounter: Payer: Self-pay | Admitting: Emergency Medicine

## 2016-05-17 ENCOUNTER — Emergency Department
Admission: EM | Admit: 2016-05-17 | Discharge: 2016-05-17 | Disposition: A | Discharge status: Home / Self Care | Payer: Medicaid Other | Source: Home / Self Care | Attending: Emergency Medicine | Admitting: Emergency Medicine

## 2016-05-17 ENCOUNTER — Encounter: Payer: Self-pay | Admitting: *Deleted

## 2016-05-17 DIAGNOSIS — B379 Candidiasis, unspecified: Secondary | ICD-10-CM | POA: Diagnosis not present

## 2016-05-17 DIAGNOSIS — Z3A19 19 weeks gestation of pregnancy: Secondary | ICD-10-CM | POA: Insufficient documentation

## 2016-05-17 DIAGNOSIS — O99332 Smoking (tobacco) complicating pregnancy, second trimester: Secondary | ICD-10-CM | POA: Insufficient documentation

## 2016-05-17 DIAGNOSIS — O30002 Twin pregnancy, unspecified number of placenta and unspecified number of amniotic sacs, second trimester: Principal | ICD-10-CM | POA: Insufficient documentation

## 2016-05-17 DIAGNOSIS — O368120 Decreased fetal movements, second trimester, not applicable or unspecified: Secondary | ICD-10-CM

## 2016-05-17 DIAGNOSIS — F17210 Nicotine dependence, cigarettes, uncomplicated: Secondary | ICD-10-CM | POA: Insufficient documentation

## 2016-05-17 DIAGNOSIS — N898 Other specified noninflammatory disorders of vagina: Secondary | ICD-10-CM | POA: Insufficient documentation

## 2016-05-17 DIAGNOSIS — O98812 Other maternal infectious and parasitic diseases complicating pregnancy, second trimester: Secondary | ICD-10-CM | POA: Diagnosis not present

## 2016-05-17 DIAGNOSIS — O26892 Other specified pregnancy related conditions, second trimester: Principal | ICD-10-CM

## 2016-05-17 LAB — URINALYSIS COMPLETE WITH MICROSCOPIC (ARMC ONLY)
Bilirubin Urine: NEGATIVE
Glucose, UA: NEGATIVE mg/dL
Hgb urine dipstick: NEGATIVE
Ketones, ur: NEGATIVE mg/dL
Leukocytes, UA: NEGATIVE
Nitrite: NEGATIVE
Protein, ur: NEGATIVE mg/dL
Specific Gravity, Urine: 1.015 (ref 1.005–1.030)
pH: 6 (ref 5.0–8.0)

## 2016-05-17 MED ORDER — FLUCONAZOLE 150 MG PO TABS: 150.0000 mg | ORAL_TABLET | Freq: Every day | ORAL | 0 refills | Status: DC

## 2016-05-17 NOTE — ED Notes (Addendum)
Pt uprite on stretcher in exam room with no distress noted; st [redacted]wks pregnant, G2P1, twin pregnancy with no complications; LMP 01/01/16; denies any pain; st some clear vag discharge with +fetal movement at present; st earlier this evening had no movement with gush of fluid after coughing

## 2016-05-17 NOTE — ED Triage Notes (Addendum)
Pt ambulatory to triage with steady gait with c/o decreased fetal movement, pelvic pain and vaginal clear with white discharge. Pt reports coughing today and states "I felt a gush come out." Pt pregnant with twins, [redacted] weeks pregnant. Pt denies vaginal pain. Pt alert and oriented x 4.

## 2016-05-17 NOTE — ED Notes (Signed)
Pt to u/s via stretcher accomp by u/s tech 

## 2016-05-17 NOTE — ED Notes (Addendum)
Fetal heart beat heard x 2 with external doppler. During assessment mother reports feeling one fetus move.

## 2016-05-17 NOTE — OB Triage Note (Signed)
Recvd pt from ED. C/O nickel to quarter sized amount of fluid that cam out when she coughed around 2330. Feeling baby move ok and ED found both Baby A and Baby B heartbeats. Denies intercourse in the past 24 hours. No vaginal bleeding.

## 2016-05-17 NOTE — ED Provider Notes (Signed)
[facility] Emergency Department Provider Note   ____________________________________________   First MD Initiated Contact with Patient 05/17/16 [phone]     (approximate)  I have reviewed the triage vital signs and the nursing notes.   HISTORY  Chief Complaint Decreased Fetal Movement    HPI Kirsten Wang is a 25 y.o. female comes into the hospital today with vaginal discharge and decreased fetal movement. The patient is approximately [redacted] weeks pregnant with twins. She reports that earlier around 11:30 to 12 she felt a gush of fluid. She reports that she checked and she does not feel that it was urine. She reports that yesterday she had some cramping but not much at all today. She reports that she had a decrease in the amount of fetal movement she had been having from previous. The patient reports that the babysitter felt very low when she's had some pressure feeling in her abdomen. The patient started feeling the babies move around 16 weeks and she reports that it's become stronger and more consistent over the past few weeks. The patient is concerned about this gush of fluid. She is a G2 P1001. The patient's last menstrual period was approximately April 28. [facility] goes to [facility] and has an appointment scheduled for Friday. She has no other complaints at this time. No pain currently and denies any pain with urination. The patient is concerned so she decided to come in and get checked out.   History reviewed. No pertinent past medical history.  Patient Active Problem List   Diagnosis Date Noted  . Indication for care in labor and delivery, antepartum 05/17/2016    Past Surgical History:  Procedure Laterality Date  . ABDOMINAL SURGERY    . klonicatresia      Prior to Admission medications   Medication Sig Start Date End Date Taking? Authorizing Provider  acetaminophen (TYLENOL) 500 MG tablet Take 500 mg by mouth every 6 (six) hours as needed.     Historical Provider, MD  albuterol (PROVENTIL HFA;VENTOLIN HFA) 108 (90 Base) MCG/ACT inhaler Inhale into the lungs every 6 (six) hours as needed for wheezing or shortness of breath.    Historical Provider, MD  ferrous sulfate 325 (65 FE) MG tablet Take 325 mg by mouth daily with breakfast.    Historical Provider, MD  fluconazole (DIFLUCAN) 150 MG tablet Take 1 tablet (150 mg total) by mouth daily. 05/17/16   Tresea MallJane Gledhill, CNM  Prenatal Vit-Fe Fumarate-FA (PRENATAL MULTIVITAMIN) TABS tablet Take 1 tablet by mouth daily at 12 noon.    Historical Provider, MD    Allergies Cephalosporins  No family history on file.  Social History Social History  Substance Use Topics  . Smoking status: Current Some Day Smoker    Packs/day: 0.50    Types: Cigarettes  . Smokeless tobacco: Never Used  . Alcohol use No     Comment: occas    Review of Systems Constitutional: No fever/chills Eyes: No visual changes. ENT: No sore throat. Cardiovascular: Denies chest pain. Respiratory: Denies shortness of breath. Gastrointestinal: Abdominal cramping Genitourinary: Vaginal discharge Musculoskeletal: Negative for back pain. Skin: Negative for rash. Neurological: Negative for headaches, focal weakness or numbness.  10-point ROS otherwise negative.  ____________________________________________   PHYSICAL EXAM:  VITAL SIGNS: ED Triage Vitals  Enc Vitals Group     BP 05/17/16 0133 119/61     Pulse Rate 05/17/16 0133 97     Resp 05/17/16 0133 18     Temp 05/17/16 0133 98.4  F (36.9 C)     Temp Source 05/17/16 0133 Oral     SpO2 05/17/16 0133 98 %     Weight 05/17/16 0134 141 lb 12.8 oz (64.3 kg)     Height 05/17/16 0133 5\' 1"  (1.549 m)     Head Circumference --      Peak Flow --      Pain Score 05/17/16 0134 5     Pain Loc --      Pain Edu? --      Excl. in GC? --     Constitutional: Alert and oriented. Well appearing and in no acute distress. Eyes: Conjunctivae are normal. PERRL.  EOMI. Head: Atraumatic. Nose: No congestion/rhinnorhea. Mouth/Throat: Mucous membranes are moist.  Oropharynx non-erythematous. Cardiovascular: Normal rate, regular rhythm. Grossly normal heart sounds.   Respiratory: Normal respiratory effort.  No retractions. Lungs CTAB. Gastrointestinal: Soft and nontender. No distention. Positive bowel sounds Genitourinary: Deferred Musculoskeletal: No lower extremity tenderness nor edema.   Neurologic:  Normal speech and language. Skin:  Skin is warm, dry and intact.  Psychiatric: Mood and affect are normal.   ____________________________________________   LABS (all labs ordered are listed, but only abnormal results are displayed)  Labs Reviewed  URINALYSIS COMPLETEWITH MICROSCOPIC ([facility] ONLY) - Abnormal; Notable for the following:       Result Value   Color, Urine YELLOW (*)    APPearance CLOUDY (*)    Bacteria, UA RARE (*)    Squamous Epithelial / LPF 0-5 (*)    All other components within normal limits   ____________________________________________  EKG  none ____________________________________________  RADIOLOGY  Ultrasound ____________________________________________   PROCEDURES  Procedure(s) performed: None  Procedures  Critical Care performed: No  ____________________________________________   INITIAL IMPRESSION / ASSESSMENT AND PLAN / ED COURSE  Pertinent labs & imaging results that were available during my care of the patient were reviewed by me and considered in my medical decision making (see chart for details).  This is a 25 year old female who is [redacted] weeks pregnant with a twin gestation, the patient is concerned about the gush of fluid. The patient will receive an [place] and will contact the OB/Gyn on call for [facility].   Clinical Course   US OB: . Live twin intrauterine gestation. No emergent fetal or maternal finding. Twins show symmetric growth with normal amniotic fluid  The patient was sent up to  labor and delivery for further evaluation of the fluid that she felt expelled from her vagina. The patient had no further complaints in the emergency department. She will be transferred upstairs to labor and delivery for further evaluation. ____________________________________________   FINAL CLINICAL IMPRESSION(S) / ED DIAGNOSES  Final diagnoses:  Vaginal discharge during pregnancy in second trimester  Decreased fetal movement during pregnancy in second trimester, antepartum, not applicable or unspecified fetus      NEW MEDICATIONS STARTED DURING THIS VISIT:  Discharge Medication List as of 05/17/2016  5:24 AM       Note:  This document was prepared using Dragon voice recognition software and may include unintentional dictation errors.    Rebecka Apley, MD 05/17/16 [phone]

## 2016-05-17 NOTE — ED Notes (Signed)
Report called to L&D, [place]; pt will be d/c to obs 4 for further eval

## 2016-05-17 NOTE — Discharge Summary (Signed)
Physician Final Progress Note  Patient ID: Kirsten Wang MRN: [id number] DOB/AGE: 04-Jan-1991 25 y.o.  Admit date: 05/17/2016 Admitting provider: Tresea Mall, CNM Discharge date: 05/17/2016   Admission Diagnoses: G1P0 at [redacted] weeks gestation with twins with complaint of leakage of fluid x1 late last night. She denies contractions or vaginal bleeding. She admits fetal movement but states she hadn't felt them as well in the past day.  Discharge Diagnoses:  Active Problems: Twin pregnancy with positive fetal heart tones,  yeast infection  Hospital Course: Pt was admitted for observation, tests done for rule out SROM  History reviewed. No pertinent past medical history.  Past Surgical History:  Procedure Laterality Date  . ABDOMINAL SURGERY    . klonicatresia      No current facility-administered medications on file prior to encounter.    Current Outpatient Prescriptions on File Prior to Encounter  Medication Sig Dispense Refill  . docusate sodium (COLACE) 100 MG capsule Take 1 capsule (100 mg total) by mouth daily as needed. 30 capsule 2  . doxycycline (VIBRA-TABS) 100 MG tablet Take 1 tablet (100 mg total) by mouth 2 (two) times daily. 20 tablet 0  . etodolac (LODINE) 400 MG tablet Take 1 tablet (400 mg total) by mouth 2 (two) times daily. 20 tablet 0  . HYDROcodone-acetaminophen (NORCO) 5-325 MG tablet Take 1 tablet by mouth every 8 (eight) hours as needed for moderate pain. 20 tablet 0  . metroNIDAZOLE (FLAGYL) 500 MG tablet Take 1 tablet (500 mg total) by mouth 2 (two) times daily. 20 tablet 0  . oxyCODONE-acetaminophen (ROXICET) 5-325 MG tablet Take 1 tablet by mouth every 6 (six) hours as needed. 15 tablet 0    Allergies  Allergen Reactions  . Cephalosporins Hives    omnicef    Social History   Social History  . Marital status: Single    Spouse name: N/A  . Number of children: N/A  . Years of education: N/A   Occupational History  . Not on file.   Social  History Main Topics  . Smoking status: Current Some Day Smoker    Packs/day: 0.50    Types: Cigarettes  . Smokeless tobacco: Never Used  . Alcohol use No     Comment: occas  . Drug use: No  . Sexual activity: Yes   Other Topics Concern  . Not on file   Social History Narrative  . No narrative on file    Physical Exam: BP (!) 117/58 (BP Location: Right Arm)   Pulse 78   Temp 98.3 F (36.8 C) (Oral)   Resp 16   LMP 01/03/2016 (Approximate)   Gen: NAD CV: RRR Pulm: CTAB Pelvic: sterile speculum cervix is visually closed Ext: no evidence of DVT  Consults: None  Significant Findings/ Diagnostic Studies:  radiology: Ultrasound: Positive fetal heart tones for both babies Nitrazine neg, Pooling neg, Ferning neg  Procedures: none  Discharge Condition: good  Disposition: 01-Home or Self Care  Diet: Regular diet  Discharge Activity: Activity as tolerated  Discharge Instructions    Discharge activity:  No Restrictions    Complete by:  As directed   Discharge diet:  No restrictions    Complete by:  As directed   No sexual activity restrictions    Complete by:  As directed   Notify physician for a general feeling that "something is not right"    Complete by:  As directed   Notify physician for increase or change in vaginal discharge  Complete by:  As directed   Notify physician for intestinal cramps, with or without diarrhea, sometimes described as "gas pain"    Complete by:  As directed   Notify physician for leaking of fluid    Complete by:  As directed   Notify physician for low, dull backache, unrelieved by heat or Tylenol    Complete by:  As directed   Notify physician for menstrual like cramps    Complete by:  As directed   Notify physician for pelvic pressure    Complete by:  As directed   Notify physician for uterine contractions.  These may be painless and feel like the uterus is tightening or the baby is  "balling up"    Complete by:  As directed   Notify  physician for vaginal bleeding    Complete by:  As directed   PRETERM LABOR:  Includes any of the follwing symptoms that occur between 20 - [redacted] weeks gestation.  If these symptoms are not stopped, preterm labor can result in preterm delivery, placing your baby at risk    Complete by:  As directed       Medication List    STOP taking these medications   docusate sodium 100 MG capsule Commonly known as:  COLACE   doxycycline 100 MG tablet Commonly known as:  VIBRA-TABS   etodolac 400 MG tablet Commonly known as:  LODINE   HYDROcodone-acetaminophen 5-325 MG tablet Commonly known as:  NORCO   metroNIDAZOLE 500 MG tablet Commonly known as:  FLAGYL   oxyCODONE-acetaminophen 5-325 MG tablet Commonly known as:  ROXICET     TAKE these medications   acetaminophen 500 MG tablet Commonly known as:  TYLENOL Take 500 mg by mouth every 6 (six) hours as needed.   albuterol 108 (90 Base) MCG/ACT inhaler Commonly known as:  PROVENTIL HFA;VENTOLIN HFA Inhale into the lungs every 6 (six) hours as needed for wheezing or shortness of breath.   ferrous sulfate 325 (65 FE) MG tablet Take 325 mg by mouth daily with breakfast.   fluconazole 150 MG tablet Commonly known as:  DIFLUCAN Take 1 tablet (150 mg total) by mouth daily.   prenatal multivitamin Tabs tablet Take 1 tablet by mouth daily at 12 noon.      Follow-up Information    [facility] [place] .   Why:  go to regular scheduled prenatal visit Contact information: [address] [place] [address] [phone]           Total time spent taking care of this patient: 15 minutes  Signed: Obie DredgeGLEDHILL,Danaysha Kirn, CNM  05/17/2016, 6:51 AM

## 2016-05-17 NOTE — ED Notes (Signed)
Pt returned to room. MD notified

## 2016-06-06 ENCOUNTER — Observation Stay
Admission: EM | Admit: 2016-06-06 | Discharge: 2016-06-06 | Disposition: A | Discharge status: Home / Self Care | Payer: Medicaid Other | Attending: Certified Nurse Midwife | Admitting: Certified Nurse Midwife

## 2016-06-06 DIAGNOSIS — O30002 Twin pregnancy, unspecified number of placenta and unspecified number of amniotic sacs, second trimester: Secondary | ICD-10-CM | POA: Diagnosis not present

## 2016-06-06 DIAGNOSIS — Z3A21 21 weeks gestation of pregnancy: Secondary | ICD-10-CM | POA: Insufficient documentation

## 2016-06-06 DIAGNOSIS — M549 Dorsalgia, unspecified: Secondary | ICD-10-CM | POA: Diagnosis not present

## 2016-06-06 DIAGNOSIS — O26899 Other specified pregnancy related conditions, unspecified trimester: Secondary | ICD-10-CM | POA: Diagnosis present

## 2016-06-06 DIAGNOSIS — R102 Pelvic and perineal pain: Secondary | ICD-10-CM | POA: Diagnosis present

## 2016-06-06 DIAGNOSIS — R109 Unspecified abdominal pain: Principal | ICD-10-CM | POA: Insufficient documentation

## 2016-06-06 HISTORY — DX: Congenital absence, atresia and stenosis of rectum without fistula: Q42.1

## 2016-06-06 HISTORY — DX: Congenital absence, atresia and stenosis of other parts of large intestine: Q42.8

## 2016-06-06 HISTORY — DX: Unspecified abnormal cytological findings in specimens from vagina: R87.629

## 2016-06-06 HISTORY — DX: Congenital absence, atresia and stenosis of anus without fistula: Q42.3

## 2016-06-06 LAB — URINALYSIS COMPLETE WITH MICROSCOPIC (ARMC ONLY)
Bacteria, UA: NONE SEEN
Bilirubin Urine: NEGATIVE
Glucose, UA: NEGATIVE mg/dL
Hgb urine dipstick: NEGATIVE
Ketones, ur: NEGATIVE mg/dL
Leukocytes, UA: NEGATIVE
Nitrite: NEGATIVE
Protein, ur: NEGATIVE mg/dL
Specific Gravity, Urine: 1.011 (ref 1.005–1.030)
pH: 6 (ref 5.0–8.0)

## 2016-06-06 MED ORDER — LACTATED RINGERS IV SOLN: 500.0000 mL | INTRAVENOUS | Status: DC | PRN

## 2016-06-06 NOTE — Final Progress Note (Signed)
Physician Final Progress Note  Patient ID: Kirsten Wang MRN: [id number] DOB/AGE: 24/12/1990 24 y.o.  Admit date: 06/06/2016 Admitting provider: Nadara Mustardobert P Harris, MD Discharge date: 06/06/2016   Admission Diagnoses: Twin gestation at 21 wk 6d with abdominal and back pain  Discharge Diagnoses:  Twin gestation with abdominal pain R/T round ligament pain Musculoskeletal back pain.  Consults:none  Significant Findings/ Diagnostic Studies: 25 year old G2 P1001 now 21 wk 6 d with di/di twins who presented with BLAP and left flank pain. Pain in lower abdomen has been intermittent since last week. Worse when up ambulating, better with rest. Denies dysuria, hematuria, fever, chills. Pain on flank when trying to lie on her left side. Babies are active, pain with some movements.[facility] also complicated by anxiety and abnormal Pap. 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   Exam: BP 114/62 Pulse=104 Abdomen: gravid, tenderness along right and left lower borders of the uterus esp with displacing uterus to left or right. Mild tenderness above left hip/ left flank. 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   albuterol 108 (90 Base) MCG/ACT inhaler Commonly known as:  PROVENTIL HFA;VENTOLIN HFA Inhale into the lungs every 6 (six) hours as needed for wheezing or shortness of breath.   ferrous sulfate 325 (65 FE) MG tablet Take 325 mg by mouth daily with breakfast.   prenatal multivitamin Tabs tablet Take 1 tablet by mouth daily at 12 noon.        Total time spent taking care of this patient: 20  minutes  Signed: Farrel ConnersGUTIERREZ, Wilhelmina Hark 06/06/2016, 5:16 PM

## 2016-06-08 LAB — URINE CULTURE

## 2016-06-09 ENCOUNTER — Observation Stay
Admission: RE | Admit: 2016-06-09 | Discharge: 2016-06-09 | Disposition: A | Discharge status: Home / Self Care | Payer: Medicaid Other | Attending: Obstetrics & Gynecology | Admitting: Obstetrics & Gynecology

## 2016-06-09 ENCOUNTER — Encounter: Payer: Self-pay | Admitting: *Deleted

## 2016-06-09 DIAGNOSIS — M545 Low back pain, unspecified: Secondary | ICD-10-CM | POA: Diagnosis present

## 2016-06-09 DIAGNOSIS — N898 Other specified noninflammatory disorders of vagina: Secondary | ICD-10-CM | POA: Insufficient documentation

## 2016-06-09 DIAGNOSIS — M549 Dorsalgia, unspecified: Secondary | ICD-10-CM | POA: Diagnosis present

## 2016-06-09 DIAGNOSIS — Z3A22 22 weeks gestation of pregnancy: Secondary | ICD-10-CM | POA: Diagnosis not present

## 2016-06-09 DIAGNOSIS — O26899 Other specified pregnancy related conditions, unspecified trimester: Secondary | ICD-10-CM | POA: Diagnosis present

## 2016-06-09 LAB — URINALYSIS COMPLETE WITH MICROSCOPIC (ARMC ONLY)
Bacteria, UA: NONE SEEN
Bilirubin Urine: NEGATIVE
Glucose, UA: NEGATIVE mg/dL
Hgb urine dipstick: NEGATIVE
Ketones, ur: NEGATIVE mg/dL
Leukocytes, UA: NEGATIVE
Nitrite: NEGATIVE
Protein, ur: NEGATIVE mg/dL
Specific Gravity, Urine: 1.014 (ref 1.005–1.030)
pH: 6 (ref 5.0–8.0)

## 2016-06-09 LAB — WET PREP, GENITAL
Clue Cells Wet Prep HPF POC: NONE SEEN
Sperm: NONE SEEN
Trich, Wet Prep: NONE SEEN
Yeast Wet Prep HPF POC: NONE SEEN

## 2016-06-09 MED ORDER — ONDANSETRON HCL 4 MG/2ML IJ SOLN: 4.0000 mg | Freq: Four times a day (QID) | INTRAMUSCULAR | Status: DC | PRN

## 2016-06-09 MED ORDER — ACETAMINOPHEN 325 MG PO TABS: 650.0000 mg | ORAL_TABLET | ORAL | Status: DC | PRN

## 2016-06-09 NOTE — H&P (Signed)
Obstetrics Admission History & Physical   Back Pain   HPI:  25 y.o. G2P1001 @ 7032w2d (10/11/2016, by Ultrasound). Admitted on 06/09/2016:   Patient Active Problem List   Diagnosis Date Noted  . Low back pain during pregnancy 06/09/2016  . Abdominal pain in pregnancy 06/06/2016  . Indication for care in labor and delivery, antepartum 05/17/2016     Presents for back pain, currently active and feels it is worse than usual, no radiation or assoc sx's. Also reports vag d/c. No bleeding or LOF.    [facility]  PMHx:  Past Medical History:  Diagnosis Date  . Atresia and stenosis of large intestine, rectum, and anal canal, congenital 1993  . Vaginal Pap smear, abnormal    PSHx:  Past Surgical History:  Procedure Laterality Date  . ABDOMINAL SURGERY    . COLON SURGERY    . klonicatresia     Medications:  Prescriptions Prior to Admission  Medication Sig Dispense Refill Last Dose  . acetaminophen (TYLENOL) 500 MG tablet Take 500 mg by mouth every 6 (six) hours as needed.   06/09/2016  . albuterol (PROVENTIL HFA;VENTOLIN HFA) 108 (90 Base) MCG/ACT inhaler Inhale into the lungs every 6 (six) hours as needed for wheezing or shortness of breath.   06/09/2016  . ferrous sulfate 325 (65 FE) MG tablet Take 325 mg by mouth daily with breakfast.   06/09/2016  . Prenatal Vit-Fe Fumarate-FA (PRENATAL MULTIVITAMIN) TABS tablet Take 1 tablet by mouth daily at 12 noon.   06/09/2016   Allergies: is allergic to cephalosporins. OBHx:  OB History  Gravida Para Term Preterm AB Living  2 1 1  0 0 1  SAB TAB Ectopic Multiple Live Births               # Outcome Date GA Lbr Len/2nd Weight Sex Delivery Anes PTL Lv  2 Current           1 Term              ZOX:WRUEAVWU/[id number]:Negative/unremarkable except as detailed in HPI. Soc Hx: Never smoker, Alcohol: none, Recreational drug use: none and Pregnancy welcomed  Objective:  There were no vitals filed for this visit. General: Well nourished, well  developed female in no acute distress.  Skin: Warm and dry.  Cardiovascular:Regular rate and rhythm. Respiratory: Clear to auscultation bilateral. Normal respiratory effort Abdomen: moderate Neuro/Psych: Normal mood and affect.   Pelvic exam: is not limited by body habitus EGBUS: within normal limits Vagina: within normal limits and with normal mucosa blood in the vault Cervix: closed and thivk and high Uterus: No contractions observed for 30 minutes.  Adnexa: not evaluated  EFM:FHR: 130 bpm, variability: minimal ,  accelerations:  Abscent,  decelerations:  Absent (22 weeks) Toco: None   Assessment & Plan:   25 y.o. G2P1001 @ 6732w2d, Admitted on 06/09/2016: Low back pain and vaginal discharge.     UA, Wet prep, FHR monitoring

## 2016-06-09 NOTE — Discharge Instructions (Signed)
Drink plenty of fluid and get plenty of rest. Call your provider for any other concerns °

## 2016-06-09 NOTE — Discharge Summary (Addendum)
Patient discharged home, discharge instructions given, patient states understanding. Patient left floor in stable condition, denies any other needs at this time. Patient to keep next scheduled OB appointment 

## 2016-06-09 NOTE — Discharge Summary (Signed)
Physician Discharge Summary  Patient ID: Kirsten Wang MRN: [id number] DOB/AGE: 24/12/1990 24 y.o.  Admit date: 06/09/2016 Discharge date: 06/09/2016  Admission Diagnoses: Low back pain  Discharge Diagnoses:  Active Problems:   Low back pain during pregnancy  Discharged Condition: good  Hospital Course: Seen and examined, no S/SX PTL.  Consults: None  Significant Diagnostic Studies: labs: UA and Wet Prep negative A NST procedure was performed with FHR monitoring and a normal baseline established, appropriate time of 20-40 minutes of evaluation, and accels >2 seen w 15x15 characteristics.  Results show a REACTIVE NST.   Treatments: None  Discharge Exam: Last menstrual period 01/03/2016. see H&P  Disposition: 01-Home or Self Care     Medication List    TAKE these medications   acetaminophen 500 MG tablet Commonly known as:  TYLENOL Take 500 mg by mouth every 6 (six) hours as needed.   albuterol 108 (90 Base) MCG/ACT inhaler Commonly known as:  PROVENTIL HFA;VENTOLIN HFA Inhale into the lungs every 6 (six) hours as needed for wheezing or shortness of breath.   ferrous sulfate 325 (65 FE) MG tablet Take 325 mg by mouth daily with breakfast.   prenatal multivitamin Tabs tablet Take 1 tablet by mouth daily at 12 noon.        Signed: Letitia Libraobert Paul Isak Sotomayor 06/09/2016, 5:24 PM

## 2016-06-09 NOTE — OB Triage Note (Signed)
Patient complains of back cramping that stared on Tuesday and has gotten worse, more pain on left side 8/10. Denies any urinary symptoms, positive flank pain on left side. Pt also states she has yellowish/ green mucus discharge started today. Babies moving well.

## 2016-06-11 ENCOUNTER — Observation Stay
Admission: EM | Admit: 2016-06-11 | Discharge: 2016-06-11 | Disposition: A | Discharge status: Other Acute Inpatient Hospital | Payer: Medicaid Other | Attending: Certified Nurse Midwife | Admitting: Certified Nurse Midwife

## 2016-06-11 ENCOUNTER — Encounter: Payer: Self-pay | Admitting: *Deleted

## 2016-06-11 DIAGNOSIS — Z3A22 22 weeks gestation of pregnancy: Secondary | ICD-10-CM | POA: Insufficient documentation

## 2016-06-11 DIAGNOSIS — Z79899 Other long term (current) drug therapy: Secondary | ICD-10-CM | POA: Insufficient documentation

## 2016-06-11 DIAGNOSIS — O4692 Antepartum hemorrhage, unspecified, second trimester: Secondary | ICD-10-CM | POA: Diagnosis present

## 2016-06-11 DIAGNOSIS — O30042 Twin pregnancy, dichorionic/diamniotic, second trimester: Secondary | ICD-10-CM | POA: Diagnosis not present

## 2016-06-11 DIAGNOSIS — O26892 Other specified pregnancy related conditions, second trimester: Secondary | ICD-10-CM

## 2016-06-11 DIAGNOSIS — M545 Low back pain, unspecified: Secondary | ICD-10-CM

## 2016-06-11 LAB — CBC
HCT: 33.1 % — ABNORMAL LOW (ref 35.0–47.0)
Hemoglobin: 11.7 g/dL — ABNORMAL LOW (ref 12.0–16.0)
MCH: 32.9 pg (ref 26.0–34.0)
MCHC: 35.5 g/dL (ref 32.0–36.0)
MCV: 92.7 fL (ref 80.0–100.0)
Platelets: 182 10*3/uL (ref 150–440)
RBC: 3.57 MIL/uL — ABNORMAL LOW (ref 3.80–5.20)
RDW: 12.8 % (ref 11.5–14.5)
WBC: 22 10*3/uL — ABNORMAL HIGH (ref 3.6–11.0)

## 2016-06-11 LAB — CHLAMYDIA/NGC RT PCR (ARMC ONLY)
Chlamydia Tr: NOT DETECTED
N gonorrhoeae: NOT DETECTED

## 2016-06-11 LAB — TYPE AND SCREEN
ABO/RH(D): O POS
Antibody Screen: NEGATIVE

## 2016-06-11 MED ORDER — SODIUM CHLORIDE FLUSH 0.9 % IV SOLN
INTRAVENOUS | Status: AC
  Filled 2016-06-11: qty 10

## 2016-06-11 MED ORDER — ONDANSETRON HCL 4 MG/2ML IJ SOLN: 4.0000 mg | Freq: Four times a day (QID) | INTRAMUSCULAR | Status: DC | PRN

## 2016-06-11 MED ORDER — LIDOCAINE HCL (PF) 1 % IJ SOLN: 30.0000 mL | INTRAMUSCULAR | Status: DC | PRN

## 2016-06-11 MED ORDER — OXYTOCIN 40 UNITS IN LACTATED RINGERS INFUSION - SIMPLE MED: 2.5000 [IU]/h | INTRAVENOUS | Status: DC

## 2016-06-11 MED ORDER — INDOMETHACIN 50 MG PO CAPS
50.0000 mg | ORAL_CAPSULE | Freq: Once | ORAL | Status: AC
  Administered 2016-06-11: 50 mg via ORAL
  Filled 2016-06-11: qty 1

## 2016-06-11 MED ORDER — MAGNESIUM SULFATE BOLUS VIA INFUSION
4.0000 g | Freq: Once | INTRAVENOUS | Status: AC
  Administered 2016-06-11: 4 g via INTRAVENOUS
  Filled 2016-06-11: qty 500

## 2016-06-11 MED ORDER — LACTATED RINGERS IV SOLN: 500.0000 mL | INTRAVENOUS | Status: DC | PRN

## 2016-06-11 MED ORDER — OXYTOCIN BOLUS FROM INFUSION: 500.0000 mL | Freq: Once | INTRAVENOUS | Status: DC

## 2016-06-11 MED ORDER — LABETALOL HCL 5 MG/ML IV SOLN: 40.0000 mg | Freq: Once | INTRAVENOUS | Status: DC | PRN

## 2016-06-11 MED ORDER — BETAMETHASONE SOD PHOS & ACET 6 (3-3) MG/ML IJ SUSP
12.0000 mg | Freq: Every day | INTRAMUSCULAR | Status: DC
  Administered 2016-06-11: 12 mg via INTRAMUSCULAR
  Filled 2016-06-11: qty 1

## 2016-06-11 MED ORDER — LACTATED RINGERS IV SOLN: INTRAVENOUS | Status: DC

## 2016-06-11 MED ORDER — MAGNESIUM SULFATE 50 % IJ SOLN
INTRAVENOUS | Status: AC
  Administered 2016-06-11: 2 g/h via INTRAVENOUS
  Filled 2016-06-11: qty 80

## 2016-06-11 MED ORDER — LACTATED RINGERS IV SOLN
2.0000 g/h | INTRAVENOUS | Status: DC
  Administered 2016-06-11: 2 g/h via INTRAVENOUS

## 2016-06-11 NOTE — Progress Notes (Signed)
Pt. Off monitor to BR..."I think I just stooled on myself."  Pt. Had uncontrollable loose BM, up to BR for clean up. Pt. Talking on cellphone during this process.

## 2016-06-11 NOTE — Progress Notes (Signed)
Patient ID: Kirsten Wang, female   DOB: 05/30/1991, 25 y.o.   MRN: [id number]  Labor Check  Subj:  Complaints: still with contraction pain. States the pain is stronger   Obj:  BP (!) 76/47   Pulse (!) 120   Temp 98 F (36.7 C) (Oral)   Resp 18   LMP 01/03/2016 (Approximate)   SpO2 100%     Cervix: Dilation: 1.5 / Effacement (%): 50 / Station: Ballotable  Baseline FHR: + x 2    Contractions: present frequency: 1 q 6-7 mins (decrease from before0  A/P: 25 y.o. G2P1001 female at 28103w4d with di/di twin gestation, preterm labor.  1.  Labor: she has received indomethacin 50mg .  After speaking with [facility], she has now received a bolus of magnesium and has maintenance magnesium dose at 2 gram/hour.   2.  FWB: reassuring x 2. S/p betamethasone 12mg  IM x 1. Per [facility], then normally give BMTZ at 6061w5d.   3.  GBS unknown  4.  Pain: prn 5.  Recheck: prior to departure. 6. Overall plan is still to transport to [facility], who has accepted her, as long as she is stable for transport. She understands that if labor progresses and she will deliver today and she is unstable for transport, [facility] does not matter as the babies or pre-viable. If delivery here is to occur, will get neonatology to speak with the patient to set expectation for delivery treatment (which would be no intervention).   Thomasene MohairStephen Jackson, MD 06/11/2016 7:11 PM

## 2016-06-11 NOTE — Progress Notes (Signed)
U/S performed by Jill Sideolleen, CNM - at the bedside.

## 2016-06-11 NOTE — Progress Notes (Signed)
Patient is feeling contractions less now, though still present.  SVE:  1.5/50/ballotable.  Stable for transfer. EMT unit on site and ready for transport.  Thomasene MohairStephen Isai Gottlieb, MD 06/11/2016 8:47 PM

## 2016-06-11 NOTE — H&P (Signed)
Obstetrics Admission History & Physical   Vaginal Bleeding   HPI:  25 y.o. G2P1001 @ 6056w4d (10/11/2016, by Ultrasound) with di/di twins presented on 06/11/2016 with vaginal bleeding/spotting since 0400 this AM. Has been having back and flank pain intermittently over this past week and has been seen twice this week for these complaints. Started having lower abdominal pain with contractions 2 hours prior to admission. Urinalyses and wet preps have been negative x 2 this week. Urine culture grew 1000 col/ml GBS. Cervix was L/TH/closed on 10/2 and 10/5, but today cx was 1/60%/-2.  Patient Active Problem List   Diagnosis Date Noted  . Preterm labor in second trimester 06/11/2016  . Low back pain during pregnancy 06/09/2016  . Abdominal pain in pregnancy 06/06/2016  . Indication for care in labor and delivery, antepartum 05/17/2016         [facility] at[facility] at [facility]  PMHx:  Past Medical History:  Diagnosis Date  . Atresia and stenosis of large intestine, rectum, and anal canal, congenital 1993  . Vaginal Pap smear, abnormal    PSHx:  Past Surgical History:  Procedure Laterality Date  . ABDOMINAL SURGERY    . COLON SURGERY    . klonicatresia     Medications:  Prescriptions Prior to Admission  Medication Sig Dispense Refill Last Dose  . acetaminophen (TYLENOL) 500 MG tablet Take 500 mg by mouth every 6 (six) hours as needed.   06/10/2016 at Unknown time  . albuterol (PROVENTIL HFA;VENTOLIN HFA) 108 (90 Base) MCG/ACT inhaler Inhale into the lungs every 6 (six) hours as needed for wheezing or shortness of breath.   06/10/2016 at Unknown time  . ferrous sulfate 325 (65 FE) MG tablet Take 325 mg by mouth daily with breakfast.   06/10/2016 at Unknown time  . Prenatal Vit-Fe Fumarate-FA (PRENATAL MULTIVITAMIN) TABS tablet Take 1 tablet by mouth daily at 12 noon.   06/10/2016 at Unknown time   Allergies: is allergic to cephalosporins. OBHx:  OB History  Gravida Para Term Preterm AB Living  2  1 1  0 0 1  SAB TAB Ectopic Multiple Live Births          1    # Outcome Date GA Lbr Len/2nd Weight Sex Delivery Anes PTL Lv  2 Current           1 Term 03/01/07    F Vag-Spont  N LIV     ZOX:WRUEAVWU/JWJXBJYNWGNFFHx:Negative/unremarkable except as detailed in HPI. Soc Hx: Never smoker, Alcohol: none, Recreational drug use: none and Pregnancy welcomed  Objective:  Vital signs: 125/75. Pulse 119-126. Temp 99.4 General: Well nourished, well developed female appears uncomfortable and holding abdomen with contractions Skin: Warm and dry.  Cardiovascular:Tachycardia with regular rhythm  Respiratory: Clear to auscultation bilateral. Normal respiratory effort Abdomen: gravid, contractions palpated-mild to moderate, tender uterus with contractions, bowel sounds active. Tenderness to palpation above left hip Neuro/Psych:anxious  Pelvic exam:  EGBUS: within normal limits Vagina: small amt  Bloody discharge in the vault Cervix: 1/60%/-2 Twin A on maternal right cephalic presentation and FHR [id number] Twin B on maternal left breech/transverse with FHR 156   Toco: contractions q2-4 min apart on arrival   Assessment & Plan:   25 y.o. G2P1001 @ 2156w4d  with di/di twins with preterm labor  P: Admit, IV, labs Consult Dr Jean RosenthalJackson for treatment plan  Farrel ConnersGUTIERREZ, Zayn Selley, CNM

## 2016-06-11 NOTE — Progress Notes (Signed)
Colleen, CNM at the bedside to perform SVE, speculum exam.  1cm .

## 2016-06-11 NOTE — Progress Notes (Signed)
Spoke with Dr. Tyler DeisWheeler of [facility] (first time paging [facility] was at about 5:20pm).  Dr. Tyler DeisWheeler has accepted the patient in transfer.  She states that there is little they can do for the babies at this point.  However, they usually give betamethasone at 9062w5d (given here already).  She also recommend magnesium for tocolysis for stabilization for transfer (already pt has been given indomethacin 50mg ).  Discussed with patient transfer and that her babies are not viable at this point. However, benefit of transfer is to get her to a place so that if she holds off on labor until 23 weeks, the NICU at [facility] might be able to offer support.  Patient expectation set that the fetuses are previable today and that delivery today here or at [facility] would not matter given that the babies are pre-viable.   Will start magnesium per [facility]'s recommendations.   Per bedside u/s presenting fetus (A, right) is cephalic and baby B is breech.   Transport has been called.  Will assess for stability of transfer with additional cervical exams prior to shipping [facility]. Pregnancy dating is based on a 6 week [facility] in [facility]. All patient questions answered.  Thomasene MohairStephen Janeice Stegall, MD 06/11/2016 6:25 PM

## 2016-06-11 NOTE — OB Triage Note (Signed)
Pt. Present with vaginal bleeding. When patient wipes, tissue has light pink blood. Baby A via doppler 147 bpm located  In RLQ, Baby B 156 bpm, heart beat located in Left  mid. Quad. Contractions started at 4 a.m. This morning.

## 2016-06-13 LAB — RPR: RPR Ser Ql: NONREACTIVE

## 2016-06-14 NOTE — Discharge Summary (Signed)
  OB Discharge Summary     Patient Name: Kirsten Danella MaiersM Papillion DOB: 01/10/1991 MRN: [id number]  Date of admission: 06/11/2016 Delivering MD: Conard NovakJackson, Kamiya Acord D, MD  Date of discharge: 06/11/2016  Admitting diagnosis: contractions, preterm labor  Intrauterine pregnancy: 8567w4d      Secondary diagnosis: None     Discharge diagnosis: Preterm labor, 6867w4d gestation                                                                                                 Hospital course:  The patient was admitted with painful contractions.  She was initially 1 cm on admission. She dilated to 1.5cm over time, where she stayed stable, though she continued to contract.  [facility] was contacted regarding management strategy in the pre-viable portion of her pregnancy.  Once stability was established [facility] was [facility] ground.  She was given her first dose of betamethasone here and was given indomethacin 50mg  loading dose. She was also started on Magnesium sulfate per [facility]'s recommendations.  Her vitals here were normal. She was [facility].   Physical exam  Vitals:   06/11/16 1936 06/11/16 2030 06/11/16 2033 06/11/16 2040  BP:  (!) 122/55  (!) 128/54  Pulse:  (!) 108  (!) 104  Resp:  20    Temp: 98.9 F (37.2 C) 98.2 F (36.8 C) 98.2 F (36.8 C)   TempSrc: Oral  Oral   SpO2:       General: alert, cooperative and no distress  HEENT: NCAT Pulm: CTAB CV: RRR Abd: gravid, non-tender Pelvic: cervix: 1.5/50/OOP Ext: no e/c/t Neuro: A&O x 3 Skin: no lesions  Labs: Lab Results  Component Value Date   WBC 22.0 (H) 06/11/2016   HGB 11.7 (L) 06/11/2016   HCT 33.1 (L) 06/11/2016   MCV 92.7 06/11/2016   PLT 182 06/11/2016    Discharge instruction: per After Visit Summary.  Medications:    Medication List    ASK your doctor about these medications   acetaminophen 500 MG tablet Commonly known as:  TYLENOL Take 500 mg by mouth every 6  (six) hours as needed.   albuterol 108 (90 Base) MCG/ACT inhaler Commonly known as:  PROVENTIL HFA;VENTOLIN HFA Inhale into the lungs every 6 (six) hours as needed for wheezing or shortness of breath.   ferrous sulfate 325 (65 FE) MG tablet Take 325 mg by mouth daily with breakfast.   prenatal multivitamin Tabs tablet Take 1 tablet by mouth daily at 12 noon.       Diet: NPO  Activity: As tolerated.   Outpatient follow up: pending hospitalization at [facility]  [facility]  SIGNED: Conard NovakJackson, Joice Nazario D, MD 06/14/2016 12:22 PM

## 2016-06-17 DIAGNOSIS — Z98891 History of uterine scar from previous surgery: Secondary | ICD-10-CM | POA: Insufficient documentation

## 2016-06-22 DIAGNOSIS — O99345 Other mental disorders complicating the puerperium: Secondary | ICD-10-CM | POA: Insufficient documentation

## 2016-06-22 DIAGNOSIS — F53 Postpartum depression: Secondary | ICD-10-CM | POA: Insufficient documentation
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
## 2017-03-22 ENCOUNTER — Encounter (HOSPITAL_COMMUNITY): Payer: Self-pay

## 2017-03-31 ENCOUNTER — Telehealth: Payer: Self-pay | Admitting: Obstetrics and Gynecology

## 2017-03-31 NOTE — Telephone Encounter (Signed)
Patient lvm stating that she has questions concerning her appointment on 04/12/2017. I called patient back and lvm for patient to call back for further assistance. Patient did not disclose any other information. Please advise.

## 2017-04-12 ENCOUNTER — Ambulatory Visit (INDEPENDENT_AMBULATORY_CARE_PROVIDER_SITE_OTHER): Payer: Medicaid Other | Admitting: Obstetrics and Gynecology

## 2017-04-12 ENCOUNTER — Encounter: Payer: Self-pay | Admitting: Obstetrics and Gynecology

## 2017-04-12 VITALS — BP 102/72 | HR 84 | Ht 61.0 in | Wt 118.6 lb

## 2017-04-12 DIAGNOSIS — O09891 Supervision of other high risk pregnancies, first trimester: Secondary | ICD-10-CM | POA: Diagnosis not present

## 2017-04-12 DIAGNOSIS — O34212 Maternal care for vertical scar from previous cesarean delivery: Secondary | ICD-10-CM

## 2017-04-12 DIAGNOSIS — Z3201 Encounter for pregnancy test, result positive: Secondary | ICD-10-CM | POA: Diagnosis not present

## 2017-04-12 DIAGNOSIS — O09219 Supervision of pregnancy with history of pre-term labor, unspecified trimester: Secondary | ICD-10-CM | POA: Diagnosis not present

## 2017-04-12 LAB — POCT URINE PREGNANCY: Preg Test, Ur: POSITIVE — AB

## 2017-04-12 NOTE — Progress Notes (Signed)
HPI:      [name] is a 26 y.o. [phone] who LMP was No LMP recorded (lmp unknown). Patient is pregnant.  Subjective:   She presents today For pregnancy confirmation. She says she had a home positive pregnancy test and "feels like she is pregnant.".  She is unsure last menstrual period of bleeding she is approximately [redacted] weeks pregnant. Her first pregnancy was a post term induction vaginal delivery. Her last pregnancy was a twin gestation. She went into labor at 23 weeks and delivered by vertical cesarean section at 23 weeks. Both babies are currently living and doing well. She has been advised by her previous physician and is aware that she should not labor during subsequent pregnancies. She is currently taking prenatal vitamins.    Hx: The following portions of the patient's history were reviewed and updated as appropriate:             She  has a past medical history of Atresia and stenosis of large intestine, rectum, and anal canal, congenital (1993) and Vaginal Pap smear, abnormal. She  does not have any pertinent problems on file. She  has a past surgical history that includes Abdominal surgery; klonicatresia; and Colon surgery. Her family history includes Depression in her maternal grandmother; Diabetes in her mother; Hypertension in her maternal grandmother. She  reports that she has been smoking Cigarettes.  She has been smoking about 0.50 packs per day. She has never used smokeless tobacco. She reports that she does not drink alcohol or use drugs. She is allergic to cephalosporins.       Review of Systems:  Review of Systems  Constitutional: Denied constitutional symptoms, night sweats, recent illness, fatigue, fever, insomnia and weight loss.  Eyes: Denied eye symptoms, eye pain, photophobia, vision change and visual disturbance.  Ears/Nose/Throat/Neck: Denied ear, nose, throat or neck symptoms, hearing loss, nasal discharge, sinus congestion and sore throat.   Cardiovascular: Denied cardiovascular symptoms, arrhythmia, chest pain/pressure, edema, exercise intolerance, orthopnea and palpitations.  Respiratory: Denied pulmonary symptoms, asthma, pleuritic pain, productive sputum, cough, dyspnea and wheezing.  Gastrointestinal: Denied, gastro-esophageal reflux, melena, nausea and vomiting.  Genitourinary: She describes occasional pelvic sharp stabbing and crampy pain. Denies bleeding.  Musculoskeletal: Denied musculoskeletal symptoms, stiffness, swelling, muscle weakness and myalgia.  Dermatologic: Denied dermatology symptoms, rash and scar.  Neurologic: Denied neurology symptoms, dizziness, headache, neck pain and syncope.  Psychiatric: Denied psychiatric symptoms, anxiety and depression.  Endocrine: Denied endocrine symptoms including hot flashes and night sweats.   Meds:   Current Outpatient Prescriptions on File Prior to Visit  Medication Sig Dispense Refill  . acetaminophen (TYLENOL) 500 MG tablet Take 500 mg by mouth every 6 (six) hours as needed.    Marland Kitchen albuterol (PROVENTIL HFA;VENTOLIN HFA) 108 (90 Base) MCG/ACT inhaler Inhale into the lungs every 6 (six) hours as needed for wheezing or shortness of breath.    . ferrous sulfate 325 (65 FE) MG tablet Take 325 mg by mouth daily with breakfast.    . Prenatal Vit-Fe Fumarate-FA (PRENATAL MULTIVITAMIN) TABS tablet Take 1 tablet by mouth daily at 12 noon.     No current facility-administered medications on file prior to visit.     Objective:     There were no vitals filed for this visit.            Urinary beta hCG positive.   Assessment:    [id number] Patient Active Problem List   Diagnosis Date Noted  . Preterm labor in second  trimester 06/11/2016  . Low back pain during pregnancy 06/09/2016  . Abdominal pain in pregnancy 06/06/2016  . Indication for care in labor and delivery, antepartum 05/17/2016     1. Pregnancy confirmed by positive urine test   2. Maternal care for vertical  scar from previous cesarean delivery   3. History of multiple gestation, currently pregnant in first trimester   4. Previous preterm labor affecting pregnancy, antepartum        Plan:            Prenatal Plan 1.  The patient was given prenatal literature. 2.  She was continued on prenatal vitamins. 3.  A prenatal lab panel was ordered or drawn. 4.  An ultrasound was ordered to better determine an EDC. 5.  A nurse visit was scheduled. 6.  We have discussed the necessity of repeat cesarean delivery at approximately 37 weeks. Patient was already aware of this.   Orders Orders Placed This Encounter  Procedures  . US OB Comp Less 14 Wks  . POCT urine pregnancy     Meds ordered this encounter  Medications  . PARoxetine (PAXIL) 10 MG tablet    Sig: Take by mouth.  . ALPRAZolam (XANAX) 1 MG tablet    Sig: Take 1 mg by mouth at bedtime as needed for anxiety.        F/U  No Follow-up on file.  Kirsten Wang, M.D. 04/12/2017 10:54 AM

## 2017-04-20 ENCOUNTER — Other Ambulatory Visit: Payer: Medicaid Other

## 2017-05-02 ENCOUNTER — Ambulatory Visit (INDEPENDENT_AMBULATORY_CARE_PROVIDER_SITE_OTHER): Payer: Medicaid Other

## 2017-05-02 ENCOUNTER — Other Ambulatory Visit: Payer: Self-pay | Admitting: Obstetrics and Gynecology

## 2017-05-02 DIAGNOSIS — O09891 Supervision of other high risk pregnancies, first trimester: Secondary | ICD-10-CM

## 2017-05-02 DIAGNOSIS — Z3201 Encounter for pregnancy test, result positive: Secondary | ICD-10-CM

## 2017-05-02 DIAGNOSIS — O34212 Maternal care for vertical scar from previous cesarean delivery: Secondary | ICD-10-CM

## 2017-05-11 ENCOUNTER — Encounter: Payer: Self-pay | Admitting: Obstetrics and Gynecology

## 2017-05-11 ENCOUNTER — Ambulatory Visit (INDEPENDENT_AMBULATORY_CARE_PROVIDER_SITE_OTHER): Payer: Medicaid Other | Admitting: Obstetrics and Gynecology

## 2017-05-11 VITALS — BP 101/70 | HR 101 | Wt 120.1 lb

## 2017-05-11 DIAGNOSIS — Z720 Tobacco use: Secondary | ICD-10-CM

## 2017-05-11 DIAGNOSIS — F419 Anxiety disorder, unspecified: Secondary | ICD-10-CM

## 2017-05-11 DIAGNOSIS — O0991 Supervision of high risk pregnancy, unspecified, first trimester: Secondary | ICD-10-CM | POA: Diagnosis not present

## 2017-05-11 DIAGNOSIS — F32A Depression, unspecified: Secondary | ICD-10-CM

## 2017-05-11 DIAGNOSIS — Z124 Encounter for screening for malignant neoplasm of cervix: Secondary | ICD-10-CM

## 2017-05-11 DIAGNOSIS — Z862 Personal history of diseases of the blood and blood-forming organs and certain disorders involving the immune mechanism: Secondary | ICD-10-CM

## 2017-05-11 DIAGNOSIS — O09891 Supervision of other high risk pregnancies, first trimester: Secondary | ICD-10-CM

## 2017-05-11 DIAGNOSIS — Z98891 History of uterine scar from previous surgery: Secondary | ICD-10-CM

## 2017-05-11 DIAGNOSIS — Z113 Encounter for screening for infections with a predominantly sexual mode of transmission: Secondary | ICD-10-CM

## 2017-05-11 DIAGNOSIS — O09211 Supervision of pregnancy with history of pre-term labor, first trimester: Secondary | ICD-10-CM

## 2017-05-11 DIAGNOSIS — Z1379 Encounter for other screening for genetic and chromosomal anomalies: Secondary | ICD-10-CM

## 2017-05-11 DIAGNOSIS — F329 Major depressive disorder, single episode, unspecified: Secondary | ICD-10-CM

## 2017-05-11 LAB — POCT URINALYSIS DIPSTICK
Bilirubin, UA: NEGATIVE
Blood, UA: NEGATIVE
Glucose, UA: NEGATIVE
Ketones, UA: NEGATIVE
Leukocytes, UA: NEGATIVE
Nitrite, UA: NEGATIVE
Protein, UA: NEGATIVE
Spec Grav, UA: 1.025 (ref 1.010–1.025)
Urobilinogen, UA: 0.2 E.U./dL
pH, UA: 6 (ref 5.0–8.0)

## 2017-05-11 MED ORDER — HYDROXYZINE HCL 25 MG PO TABS: 50.0000 mg | ORAL_TABLET | Freq: Four times a day (QID) | ORAL | 2 refills | Status: DC | PRN

## 2017-05-11 MED ORDER — CITRANATAL BLOOM 90-1 MG PO TABS: 90.0000 mg | ORAL_TABLET | Freq: Every day | ORAL | 11 refills | Status: DC

## 2017-05-11 MED ORDER — FUSION PLUS PO CAPS: 30.0000 mg | ORAL_CAPSULE | Freq: Every day | ORAL | 11 refills | Status: DC

## 2017-05-11 NOTE — Progress Notes (Signed)
OBSTETRIC INITIAL PRENATAL VISIT  Subjective:    Kirsten Wang is being seen today for her first obstetrical visit.  This is not a planned pregnancy. She is a 26 y.o. [phone] female at [redacted]w[redacted]d gestation, Estimated Date of Delivery: 11/22/17 by 10 week sono, LMP unknown. Her obstetrical history is significant for anemia,  h/o preterm twin delivery at 23 weeks, prior C-section x 1 (classical), anxiety and depression, short interval between pregnancy, tobacco use. Relationship with FOB: significant other, living together. Patient does intend to breast feed. Pregnancy history fully reviewed.    Obstetric History   G3   P2   T1   P1   A0   L3    SAB0   TAB0   Ectopic0   Multiple1   Live Births3     # Outcome Date GA Lbr Len/2nd Weight Sex Delivery Anes PTL Lv  3 Current           2A Preterm 06/2016 [redacted]w[redacted]d  1 lb 3 oz (0.539 kg) M CS-Unspec  Y LIV  2B Preterm 06/2016 [redacted]w[redacted]d  1 lb 5 oz (0.595 kg) M   Y LIV  1 Term 03/01/07 [redacted]w[redacted]d  7 lb 12 oz (3.515 kg) F Vag-Spont  N LIV    Obstetric Comments  G2 - pregnancy complicated by preterm labor requiring delivery at 23 weeks, classical C-section    Gynecologic History:  Last pap smear was: patient unsure.  Results were normal.  Denies h/o abnormal pap smaers in the past.  Denies history of STIs.    Past Medical History:  Diagnosis Date  . Atresia and stenosis of large intestine, rectum, and anal canal, congenital 1993  . Vaginal Pap smear, abnormal      Family History  Problem Relation Age of Onset  . Diabetes Mother   . Depression Maternal Grandmother   . Hypertension Maternal Grandmother      Past Surgical History:  Procedure Laterality Date  . ABDOMINAL SURGERY    . COLON SURGERY    . klonicatresia       Social History   Social History  . Marital status: Single    Spouse name: N/A  . Number of children: N/A  . Years of education: N/A   Occupational History  . Not on file.   Social History Main Topics  . Smoking status:  Current Some Day Smoker    Packs/day: 0.50    Types: Cigarettes  . Smokeless tobacco: Never Used  . Alcohol use No     Comment: occas  . Drug use: No  . Sexual activity: Yes    Birth control/ protection: Pill   Other Topics Concern  . Not on file   Social History Narrative  . No narrative on file     Current Outpatient Prescriptions on File Prior to Visit  Medication Sig Dispense Refill  . acetaminophen (TYLENOL) 500 MG tablet Take 500 mg by mouth every 6 (six) hours as needed.    Marland Kitchen albuterol (PROVENTIL HFA;VENTOLIN HFA) 108 (90 Base) MCG/ACT inhaler Inhale into the lungs every 6 (six) hours as needed for wheezing or shortness of breath.    . ALPRAZolam (XANAX) 1 MG tablet Take 1 mg by mouth at bedtime as needed for anxiety.    . ferrous sulfate 325 (65 FE) MG tablet Take 325 mg by mouth daily with breakfast.    . PARoxetine (PAXIL) 10 MG tablet Take by mouth.    . Prenatal Vit-Fe Fumarate-FA (PRENATAL MULTIVITAMIN) TABS tablet  Take 1 tablet by mouth daily at 12 noon.     No current facility-administered medications on file prior to visit.      Allergies  Allergen Reactions  . Cephalosporins Hives    omnicef     Review of Systems General:Not Present- Fever, Weight Loss and Weight Gain. Skin:Not Present- Rash. HEENT:Not Present- Blurred Vision, Headache and Bleeding Gums. Respiratory:Not Present- Difficulty Breathing. Breast:Not Present- Breast Mass. Cardiovascular:Not Present- Chest Pain, Elevated Blood Pressure, Fainting / Blacking Out and Shortness of Breath. Gastrointestinal:Not Present- Abdominal Pain, Constipation, Nausea and Vomiting. Female Genitourinary:Not Present- Frequency, Painful Urination, Pelvic Pain, Vaginal Bleeding, Vaginal Discharge, Contractions, regular, Fetal Movements Decreased, Urinary Complaints and Vaginal Fluid. Musculoskeletal:Not Present- Back Pain and Leg Cramps. Neurological:Not Present- Dizziness. Psychiatric:Not Present-  Depression.     Objective:   Blood pressure 101/70, pulse (!) 101, weight 120 lb 1.6 oz (54.5 kg), unknown if currently breastfeeding.  Body mass index is 22.69 kg/m.  General Appearance:    Alert, cooperative, no distress, appears stated age  Head:    Normocephalic, without obvious abnormality, atraumatic  Eyes:    PERRL, conjunctiva/corneas clear, EOM's intact, both eyes  Ears:    Normal external ear canals, both ears  Nose:   Nares normal, septum midline, mucosa normal, no drainage or sinus tenderness  Throat:   Lips, mucosa, and tongue normal; teeth and gums normal  Neck:   Supple, symmetrical, trachea midline, no adenopathy; thyroid: no enlargement/tenderness/nodules; no carotid bruit or JVD  Back:     Symmetric, no curvature, ROM normal, no CVA tenderness  Lungs:     Clear to auscultation bilaterally, respirations unlabored  Chest Wall:    No tenderness or deformity   Heart:    Regular rate and rhythm, S1 and S2 normal, no murmur, rub or gallop  Breast Exam:    No tenderness, masses, or nipple abnormality  Abdomen:     Soft, non-tender, bowel sounds active all four quadrants, no masses, no organomegaly.  FH 12.  FHT 152  bpm. Well healed Pfannenstiel incision noted.   Genitalia:    Pelvic:external genitalia normal, vagina without lesions, discharge, or tenderness, rectovaginal septum  normal. Cervix normal in appearance, no cervical motion tenderness, no adnexal masses or tenderness.  Pregnancy positive findings: uterine enlargement: 12 wk size, nontender.   Rectal:    Normal external sphincter.  No hemorrhoids appreciated. Internal exam not done.   Extremities:   Extremities normal, atraumatic, no cyanosis or edema  Pulses:   2+ and symmetric all extremities  Skin:   Skin color, texture, turgor normal, no rashes or lesions  Lymph nodes:   Cervical, supraclavicular, and axillary nodes normal  Neurologic:   CNII-XII intact, normal strength, sensation and reflexes throughout         Assessment:   Pregnancy at 12 and 1/7 weeks, high risk History of iron deficiency anemia  History of cesarean section, classical Tobacco abuse Anxiety and depression H/O preterm delivery, currently pregnant, first trimester Short interval between pregnancies affecting pregnancy in first trimester, antepartum  Plan:   - Initial labs ordered. - Prenatal vitamins encouraged. - Problem list reviewed and updated. - New OB counseling:  The patient has been given an overview regarding routine prenatal care.  - Recommendations regarding diet, weight gain, and exercise in pregnancy were given. - Prenatal testing, optional genetic testing, and ultrasound use in pregnancy were reviewed.  - Patient desires cell-free DNA testing.  Ordered MaterniT21.  - Benefits of Breast Feeding were discussed. The  patient is encouraged to consider nursing her baby post partum. - Tobacco abuse, encouraged smoking cessation.  - Anxiety and depression, currently on Xanax and Paxil.  Advised to discontinue Xanax, will change to Atarax.  Also to discontinue (by weaning) from Paxil as it has been noted to have slight increase in heart defects.  Recommend switching to Zoloft of Prozac.   - Short interval between pregnancies (and h/o preterm labor), patient at increased for miscarriage, however is close to second trimester so risk should decrease.   - H/o preterm delivery (23 weeks, twin pregnancy).  Required classical C-section.  Patient will require C-section for all future deliveries. Will plan for C-section at 37 weeks. Briefly discussed 17-OHP injections with patient.  Advised that there may not be much of a noted benefit with twin pregnancies.  Patient notes that she will think about it, but will likely not get it.  - Follow up in 4 weeks.  59% of 30 min visit spent on counseling and coordination of care.     Hildred Laserherry, Ashly Yepez, MD [facility]

## 2017-05-12 LAB — URINALYSIS, ROUTINE W REFLEX MICROSCOPIC
Bilirubin, UA: NEGATIVE
Glucose, UA: NEGATIVE
Ketones, UA: NEGATIVE
Leukocytes, UA: NEGATIVE
Nitrite, UA: NEGATIVE
Protein, UA: NEGATIVE
RBC, UA: NEGATIVE
Specific Gravity, UA: 1.013 (ref 1.005–1.030)
Urobilinogen, Ur: 0.2 mg/dL (ref 0.2–1.0)
pH, UA: 5.5 (ref 5.0–7.5)

## 2017-05-12 LAB — PAIN MGT SCRN (14 DRUGS), UR
Amphetamine Scrn, Ur: NEGATIVE ng/mL
BARBITURATE SCREEN URINE: NEGATIVE ng/mL
BENZODIAZEPINE SCREEN, URINE: POSITIVE ng/mL — AB
Buprenorphine, Urine: NEGATIVE ng/mL
CANNABINOIDS UR QL SCN: POSITIVE ng/mL — AB
Cocaine (Metab) Scrn, Ur: NEGATIVE ng/mL
Creatinine(Crt), U: 91.7 mg/dL (ref 20.0–300.0)
Fentanyl, Urine: NEGATIVE pg/mL
Meperidine Screen, Urine: NEGATIVE ng/mL
Methadone Screen, Urine: NEGATIVE ng/mL
OXYCODONE+OXYMORPHONE UR QL SCN: NEGATIVE ng/mL
Opiate Scrn, Ur: NEGATIVE ng/mL
Ph of Urine: 5.5 (ref 4.5–8.9)
Phencyclidine Qn, Ur: NEGATIVE ng/mL
Propoxyphene Scrn, Ur: NEGATIVE ng/mL
Tramadol Screen, Urine: NEGATIVE ng/mL

## 2017-05-13 ENCOUNTER — Encounter: Payer: Self-pay | Admitting: Obstetrics and Gynecology

## 2017-05-13 DIAGNOSIS — O09891 Supervision of other high risk pregnancies, first trimester: Secondary | ICD-10-CM | POA: Insufficient documentation

## 2017-05-13 DIAGNOSIS — F419 Anxiety disorder, unspecified: Secondary | ICD-10-CM

## 2017-05-13 DIAGNOSIS — F329 Major depressive disorder, single episode, unspecified: Secondary | ICD-10-CM | POA: Insufficient documentation

## 2017-05-13 DIAGNOSIS — Z8751 Personal history of pre-term labor: Secondary | ICD-10-CM | POA: Insufficient documentation

## 2017-05-13 DIAGNOSIS — F32A Depression, unspecified: Secondary | ICD-10-CM | POA: Insufficient documentation

## 2017-05-13 LAB — NUSWAB VAGINITIS PLUS (VG+)
Candida albicans, NAA: NEGATIVE
Candida glabrata, NAA: NEGATIVE
Chlamydia trachomatis, NAA: NEGATIVE
Neisseria gonorrhoeae, NAA: NEGATIVE
Trich vag by NAA: NEGATIVE

## 2017-05-13 LAB — CBC WITH DIFFERENTIAL/PLATELET
Basophils Absolute: 0 10*3/uL (ref 0.0–0.2)
Basos: 0 %
EOS (ABSOLUTE): 0 10*3/uL (ref 0.0–0.4)
Eos: 0 %
Hematocrit: 42.3 % (ref 34.0–46.6)
Hemoglobin: 14.5 g/dL (ref 11.1–15.9)
Immature Grans (Abs): 0 10*3/uL (ref 0.0–0.1)
Immature Granulocytes: 0 %
Lymphocytes Absolute: 2.2 10*3/uL (ref 0.7–3.1)
Lymphs: 17 %
MCH: 30.7 pg (ref 26.6–33.0)
MCHC: 34.3 g/dL (ref 31.5–35.7)
MCV: 90 fL (ref 79–97)
Monocytes Absolute: 0.7 10*3/uL (ref 0.1–0.9)
Monocytes: 5 %
Neutrophils Absolute: 9.9 10*3/uL — ABNORMAL HIGH (ref 1.4–7.0)
Neutrophils: 78 %
Platelets: 271 10*3/uL (ref 150–379)
RBC: 4.72 x10E6/uL (ref 3.77–5.28)
RDW: 12.9 % (ref 12.3–15.4)
WBC: 12.7 10*3/uL — ABNORMAL HIGH (ref 3.4–10.8)

## 2017-05-13 LAB — ANTIBODY SCREEN: Antibody Screen: NEGATIVE

## 2017-05-13 LAB — PAP IG, CT-NG, RFX HPV ASCU
Chlamydia, Nuc. Acid Amp: NEGATIVE
Gonococcus by Nucleic Acid Amp: NEGATIVE
PAP Smear Comment: 0

## 2017-05-13 LAB — RUBELLA SCREEN: Rubella Antibodies, IGG: 4.02 index (ref >0.99)

## 2017-05-13 LAB — ABO AND RH: Rh Factor: POSITIVE

## 2017-05-13 LAB — URINE CULTURE: Organism ID, Bacteria: NO GROWTH

## 2017-05-13 LAB — HIV ANTIBODY (ROUTINE TESTING W REFLEX): HIV Screen 4th Generation wRfx: NONREACTIVE

## 2017-05-13 LAB — HEPATITIS B SURFACE ANTIGEN: Hepatitis B Surface Ag: NEGATIVE

## 2017-05-13 LAB — VARICELLA ZOSTER ANTIBODY, IGG: Varicella zoster IgG: 1126 index (ref >165)

## 2017-05-13 LAB — RPR: RPR Ser Ql: NONREACTIVE

## 2017-05-15 ENCOUNTER — Other Ambulatory Visit: Payer: Self-pay | Admitting: *Deleted

## 2017-05-15 ENCOUNTER — Telehealth: Payer: Self-pay | Admitting: Obstetrics and Gynecology

## 2017-05-15 NOTE — Telephone Encounter (Signed)
Called pt informed her of +MJ, and all other labs vastly negative. Informed her that genetic results have not returned yet. Will call when they do.

## 2017-05-15 NOTE — Telephone Encounter (Signed)
Patient called and stated that she has some questions in regards to her Results, and The patient also stated that she is not very familiar with MyChart. Patient did not disclose any other information. Please advise.

## 2017-05-16 LAB — MATERNIT21  PLUS CORE+ESS+SCA, BLOOD
Chromosome 13: NEGATIVE
Chromosome 18: NEGATIVE
Chromosome 21: NEGATIVE
Y Chromosome: DETECTED

## 2017-05-17 ENCOUNTER — Telehealth: Payer: Self-pay

## 2017-05-17 NOTE — Telephone Encounter (Signed)
Called pt no answer. LM for pt informing her of the information below. Advised pt call back with medication choose or send mychart message.

## 2017-05-17 NOTE — Telephone Encounter (Signed)
-----   Message from Hildred LaserAnika Cherry, MD sent at 05/13/2017  7:52 PM EDT ----- Regarding: medication change Please advise patient that I have further reviewed her medications, it is advisable that she discontinue her Paxil due to a small increased risk of heart defects.  Should wean off medication (take 1/2 tablet for 1 week, then come off). Will change to Prozac 20 mg (q a.m., or Zoloft 50 mg daily if she has already tried Prozac in the past).  I also advised her to discontinue her Xanax, and already prescribed Atarax for her anxiety.

## 2017-05-22 ENCOUNTER — Telehealth: Payer: Self-pay | Admitting: Obstetrics and Gynecology

## 2017-05-22 DIAGNOSIS — F419 Anxiety disorder, unspecified: Principal | ICD-10-CM

## 2017-05-22 DIAGNOSIS — F329 Major depressive disorder, single episode, unspecified: Secondary | ICD-10-CM

## 2017-05-22 DIAGNOSIS — F32A Depression, unspecified: Secondary | ICD-10-CM

## 2017-05-22 MED ORDER — FLUOXETINE HCL 20 MG PO CAPS: 20.0000 mg | ORAL_CAPSULE | Freq: Every day | ORAL | 3 refills | Status: DC

## 2017-05-22 NOTE — Telephone Encounter (Signed)
-----   Message from Hildred Laser, MD sent at 05/22/2017 12:07 PM EDT ----- Regarding: RE: medication change Has she ever tried Prozac? Can we send this in for her (20 mg q a.m.) if she has not.  And ensure she is weaning from her Paxil.   ----- Message ----- From: Debbe Bales, CMA Sent: 05/17/2017   4:23 PM To: Hildred Laser, MD Subject: RE: medication change                          Done.   [place] ----- Message ----- From: Hildred Laser, MD Sent: 05/13/2017   7:52 PM To: [place], CMA Subject: medication change                              Please advise patient that I have further reviewed her medications, it is advisable that she discontinue her Paxil due to a small increased risk of heart defects.  Should wean off medication (take 1/2 tablet for 1 week, then come off). Will change to Prozac 20 mg (q a.m., or Zoloft 50 mg daily if she has already tried Prozac in the past).  I also advised her to discontinue her Xanax, and already prescribed Atarax for her anxiety.

## 2017-05-22 NOTE — Telephone Encounter (Signed)
Called pt she states that she her psychiatrist told her last Wednesday to d/c Lexapro and gave rx for Zoloft, however pt has not yet began taking it. Pt feels that she is "doing ok" right now and will fill rx if needed. PT also request [facility] rx for whole milk, will fax.

## 2017-05-22 NOTE — Telephone Encounter (Signed)
Patient called and stated that she need a fax sent to [facility], for a  prescription for 2% and whole milk. Patient also stated that she would like a call back to discuss what she needs to do, because she has the wrong kind of milk that is Lactose. No other information was disclosed. Please advise.

## 2017-05-23 NOTE — Telephone Encounter (Signed)
Ok. Thanks!

## 2017-05-25 NOTE — Telephone Encounter (Signed)
See following telephone encounter, spoke with pt she has d/c Paxil. Does not desire to take any replacement at this time.

## 2017-05-30 ENCOUNTER — Encounter: Payer: Medicaid Other | Admitting: Obstetrics and Gynecology

## 2017-06-08 ENCOUNTER — Encounter: Payer: Medicaid Other | Admitting: Obstetrics and Gynecology

## 2017-06-13 ENCOUNTER — Other Ambulatory Visit: Payer: Self-pay | Admitting: Obstetrics and Gynecology

## 2017-06-13 ENCOUNTER — Ambulatory Visit (INDEPENDENT_AMBULATORY_CARE_PROVIDER_SITE_OTHER): Payer: Medicaid Other | Admitting: Obstetrics and Gynecology

## 2017-06-13 DIAGNOSIS — O09891 Supervision of other high risk pregnancies, first trimester: Secondary | ICD-10-CM

## 2017-06-13 DIAGNOSIS — Z98891 History of uterine scar from previous surgery: Secondary | ICD-10-CM

## 2017-06-13 DIAGNOSIS — F329 Major depressive disorder, single episode, unspecified: Secondary | ICD-10-CM

## 2017-06-13 DIAGNOSIS — F32A Depression, unspecified: Secondary | ICD-10-CM

## 2017-06-13 DIAGNOSIS — F419 Anxiety disorder, unspecified: Secondary | ICD-10-CM

## 2017-06-13 DIAGNOSIS — Z862 Personal history of diseases of the blood and blood-forming organs and certain disorders involving the immune mechanism: Secondary | ICD-10-CM

## 2017-06-13 DIAGNOSIS — O0992 Supervision of high risk pregnancy, unspecified, second trimester: Secondary | ICD-10-CM

## 2017-06-13 NOTE — Progress Notes (Signed)
ROB:  AFP today.  U/S sched for FAS.  Pt taking Fe++.  Discussed CD at @ 37 weeks.  Pt taking Buspar for anxiety and depression - going "pretty well".

## 2017-06-16 LAB — AFP, SERUM, OPEN SPINA BIFIDA
AFP MoM: 1.03
AFP Value: 43.3 ng/mL
Gest. Age on Collection Date: 16.9 weeks
Maternal Age At EDD: 25.2 yr
OSBR Risk 1 IN: 10000
Test Results:: NEGATIVE
Weight: 125 [lb_av]

## 2017-07-04 ENCOUNTER — Telehealth: Payer: Self-pay | Admitting: Obstetrics and Gynecology

## 2017-07-04 NOTE — Telephone Encounter (Signed)
Spoke with pt and verified sxs, Pt denies contractions, fluid leakage, dysuria, urinary frequency, hematuria, vaginal bleeding, fever,chills,body aches, nausea or vomiting.  Pt states she has tried heating pad. Suggested pt try to alternate between ice and heat and tylenol and if no improvement in 24 hours may want to see us. Pt understood and had no additional questions at this time. Nothing further is needed

## 2017-07-04 NOTE — Telephone Encounter (Signed)
Patient called and stated that she is having severe back pain, And would like for a nurse to call her back to discuss her concerns. No other information was disclosed. Please advise.

## 2017-07-06 ENCOUNTER — Other Ambulatory Visit: Payer: Self-pay | Admitting: Obstetrics and Gynecology

## 2017-07-06 DIAGNOSIS — Z369 Encounter for antenatal screening, unspecified: Secondary | ICD-10-CM

## 2017-07-11 ENCOUNTER — Ambulatory Visit (INDEPENDENT_AMBULATORY_CARE_PROVIDER_SITE_OTHER): Payer: Medicaid Other | Admitting: Obstetrics and Gynecology

## 2017-07-11 ENCOUNTER — Other Ambulatory Visit: Payer: Self-pay | Admitting: Obstetrics and Gynecology

## 2017-07-11 ENCOUNTER — Telehealth: Payer: Self-pay | Admitting: Obstetrics and Gynecology

## 2017-07-11 ENCOUNTER — Ambulatory Visit (INDEPENDENT_AMBULATORY_CARE_PROVIDER_SITE_OTHER): Payer: Medicaid Other

## 2017-07-11 VITALS — BP 92/55 | HR 88 | Wt 131.0 lb

## 2017-07-11 DIAGNOSIS — O26899 Other specified pregnancy related conditions, unspecified trimester: Secondary | ICD-10-CM

## 2017-07-11 DIAGNOSIS — Z369 Encounter for antenatal screening, unspecified: Secondary | ICD-10-CM

## 2017-07-11 DIAGNOSIS — R102 Pelvic and perineal pain: Secondary | ICD-10-CM

## 2017-07-11 DIAGNOSIS — O0992 Supervision of high risk pregnancy, unspecified, second trimester: Secondary | ICD-10-CM

## 2017-07-11 DIAGNOSIS — Z3482 Encounter for supervision of other normal pregnancy, second trimester: Secondary | ICD-10-CM

## 2017-07-11 MED ORDER — HYDROCODONE-ACETAMINOPHEN 5-325 MG PO TABS: 1.0000 | ORAL_TABLET | Freq: Four times a day (QID) | ORAL | 0 refills | Status: DC | PRN

## 2017-07-11 NOTE — Progress Notes (Signed)
ROB: Patient complains pelvic pain that feels like it is ripping her apart. States she feels like a "wishbone that is trying to be pulled apart".  Pain is 10/10, makes it difficult to sleep at night.  Has been ongoing for several weeks.  Also notes that it terrifies her as she is afraid of having another preterm baby.  She notes that she has tried Tylenol ES, Tylenol PM, warm baths, heating pads, different pillows, and once even tried to take ibuprofen as nothing else was helping her pain (although she notes this did not help much either). Also notes the pain in her lower back and feels like that area is swollen.  Exam today with mild swelling in lumbar area.  Cervical exam with scant vaginal discharge, cervix closed.  Discussed pelvic pain in pregnancy, especially due to shifting joints of pelvis and fetal positioning, as well as prior Classical C-section scar.  Given referral to pelvic floor physical therapy, chiropractor, and given small amount of pain meds until care established with therapist. Had normal anatomy scan.  RTC in 2 weeks.

## 2017-07-11 NOTE — Telephone Encounter (Signed)
Patient called stating her script for hydrocodone needs prior authorization. Thanks

## 2017-07-11 NOTE — Progress Notes (Signed)
Pt feels a lot of pelvic pain that it feels like it is ripping apart pain scale is a 10,pain makes it difficult to sleep she is still having lower back pain and has some swelling , pt unable to give a urine sample

## 2017-07-12 NOTE — Telephone Encounter (Signed)
Attempted to reach pt's pharmacy. It is not open yet

## 2017-07-12 NOTE — Telephone Encounter (Addendum)
Called pt's pharmacy and they stating medicaid will not cover pt's hydrocodone and that a prior auth is needed for coverage. They looked into it and they stated that medicaid will not cover 30 tabs for a 8 day supply but they will cover 28 for a 7 day supply. Gave verbal ok to switch. They state pt will have a $3 co-pay. Called pt to inform her of change. No answer, left vm. Pharmacy states rx will be ready in half and hour.   Dr. Valentino Saxonherry is aware, spoke about this in office

## 2017-07-13 ENCOUNTER — Telehealth: Payer: Self-pay | Admitting: Obstetrics and Gynecology

## 2017-07-13 NOTE — Telephone Encounter (Signed)
Blood pressure was still low yesterday and she hasn't had a way to check it today but she's feeling weak and light headed   Please call

## 2017-07-13 NOTE — Telephone Encounter (Signed)
Spoke with pt and informed of her Dr. Oretha Milchherry's message. Pt understood and states if anything worsen happens then she will reach back out to us

## 2017-07-13 NOTE — Telephone Encounter (Signed)
Jasmine,   No further recommendations at this time.  That is typically what I inform patients to do with these symptoms.

## 2017-07-13 NOTE — Telephone Encounter (Signed)
Pt states yesterday she had her blood pressure rechecked yesterday and it was running still 92 over 55. Pt is still feeling dizzy and feeling weak.  Denies blurred vision, pulse does not feel abnormal and when it was checked was wnl. Pt states she is trying to increase her salt intake. I also encouraged pt to assure she keeping herself well hydrated and be mindful when changing positions.   Dr. Valentino Saxonherry, do you have further recommendations

## 2017-07-18 ENCOUNTER — Ambulatory Visit: Payer: Medicaid Other

## 2017-07-19 ENCOUNTER — Ambulatory Visit: Payer: Medicaid Other

## 2017-07-21 ENCOUNTER — Ambulatory Visit: Payer: Medicaid Other

## 2017-07-24 ENCOUNTER — Ambulatory Visit: Payer: Medicaid Other

## 2017-07-31 ENCOUNTER — Telehealth: Payer: Self-pay | Admitting: Obstetrics and Gynecology

## 2017-07-31 NOTE — Telephone Encounter (Signed)
Patient lvm stating that she is having an issue with her medication. The patient would like for a nurse to give her a call back. Please advise.

## 2017-07-31 NOTE — Telephone Encounter (Signed)
Spoke with pt- she went to the pharmacy and her vitamins were there so nothing needs to be done with this message.

## 2017-08-01 ENCOUNTER — Ambulatory Visit: Payer: Medicaid Other | Attending: Obstetrics and Gynecology

## 2017-08-07 ENCOUNTER — Other Ambulatory Visit: Payer: Self-pay | Admitting: Obstetrics and Gynecology

## 2017-08-08 ENCOUNTER — Telehealth: Payer: Self-pay | Admitting: Obstetrics and Gynecology

## 2017-08-08 NOTE — Telephone Encounter (Signed)
Spoke with pt and she is aware that an appt is needed for her refill. She states she has an appt tomorrow.   Kirsten Wang, Pt wanted rx sent for her ferrous sulfate. Never filled by our office, is it ok to send in and for what qty

## 2017-08-08 NOTE — Telephone Encounter (Signed)
The patient called and stated that she needs a refill on HYDROcodone-acetaminophen (NORCO/VICODIN) 5-325 MG tablet, and her "stoo.l softener that has iron in it. No other information was disclosed. Please advise.

## 2017-08-08 NOTE — Telephone Encounter (Signed)
She really is ok right now as far as her iron levels, but if she desires to take an additional iron pill she can just take a 325 mg tablet daily as pregnancy does tend to increase iron levels.

## 2017-08-09 ENCOUNTER — Encounter: Payer: Self-pay | Admitting: Obstetrics and Gynecology

## 2017-08-09 ENCOUNTER — Ambulatory Visit (INDEPENDENT_AMBULATORY_CARE_PROVIDER_SITE_OTHER): Payer: Medicaid Other | Admitting: Obstetrics and Gynecology

## 2017-08-09 VITALS — BP 97/65 | HR 92 | Wt 136.4 lb

## 2017-08-09 DIAGNOSIS — R102 Pelvic and perineal pain: Secondary | ICD-10-CM

## 2017-08-09 DIAGNOSIS — O0992 Supervision of high risk pregnancy, unspecified, second trimester: Secondary | ICD-10-CM

## 2017-08-09 NOTE — Telephone Encounter (Signed)
 DecemberSharon spoke with pt when she came to office for her visit today with Logan BoresEvans

## 2017-08-09 NOTE — Progress Notes (Signed)
ROB: She continues to complain about the pelvic pain "ripping her apart" says hydrocodone is not helping.  I refused a another prescription refill.  Patient to begin physical therapy tomorrow.  She denies contractions denies vaginal bleeding.  Reports active fetal movement.  We discussed pubic symphysis and pain in detail strategies of Tylenol, warm water bottle, slow changing positions etc. Discussed.  One hour GCT with next visit

## 2017-08-10 ENCOUNTER — Ambulatory Visit: Payer: Medicaid Other

## 2017-08-10 ENCOUNTER — Telehealth: Payer: Self-pay | Admitting: Obstetrics and Gynecology

## 2017-08-10 NOTE — Telephone Encounter (Signed)
Pt aware AC is not going to give her a rx of norco. Encouraged tylenol 1000 q 6h. Warm bath. Push fluids. Keep PT appt. Pt voices understanding.

## 2017-08-10 NOTE — Telephone Encounter (Signed)
Patient called requesting a refill on hydrocodone. She states that Dr Valentino Saxonherry gave her the script and she states Dr Logan BoresEvans would not give her a script. Thanks

## 2017-08-18 ENCOUNTER — Encounter: Payer: Medicaid Other | Admitting: Obstetrics and Gynecology

## 2017-08-24 ENCOUNTER — Encounter: Payer: Medicaid Other | Admitting: Obstetrics and Gynecology

## 2017-09-01 ENCOUNTER — Encounter: Payer: Self-pay | Admitting: Obstetrics and Gynecology

## 2017-09-01 ENCOUNTER — Ambulatory Visit (INDEPENDENT_AMBULATORY_CARE_PROVIDER_SITE_OTHER): Payer: Medicaid Other | Admitting: Obstetrics and Gynecology

## 2017-09-01 VITALS — BP 90/68 | HR 103 | Wt 143.7 lb

## 2017-09-01 DIAGNOSIS — Z23 Encounter for immunization: Secondary | ICD-10-CM | POA: Diagnosis not present

## 2017-09-01 DIAGNOSIS — O0993 Supervision of high risk pregnancy, unspecified, third trimester: Secondary | ICD-10-CM | POA: Diagnosis not present

## 2017-09-01 DIAGNOSIS — R102 Pelvic and perineal pain: Secondary | ICD-10-CM

## 2017-09-01 DIAGNOSIS — O26899 Other specified pregnancy related conditions, unspecified trimester: Secondary | ICD-10-CM

## 2017-09-01 DIAGNOSIS — Z113 Encounter for screening for infections with a predominantly sexual mode of transmission: Secondary | ICD-10-CM

## 2017-09-01 DIAGNOSIS — Z98891 History of uterine scar from previous surgery: Secondary | ICD-10-CM

## 2017-09-01 DIAGNOSIS — Z130 Encounter for screening for diseases of the blood and blood-forming organs and certain disorders involving the immune mechanism: Secondary | ICD-10-CM

## 2017-09-01 DIAGNOSIS — Z131 Encounter for screening for diabetes mellitus: Secondary | ICD-10-CM

## 2017-09-01 LAB — POCT URINALYSIS DIPSTICK
Bilirubin, UA: NEGATIVE
Glucose, UA: NEGATIVE
Ketones, UA: NEGATIVE
Leukocytes, UA: NEGATIVE
Nitrite, UA: NEGATIVE
Protein, UA: NEGATIVE
Spec Grav, UA: 1.02 (ref 1.010–1.025)
Urobilinogen, UA: 0.2 E.U./dL
pH, UA: 8 (ref 5.0–8.0)

## 2017-09-01 MED ORDER — HYDROMORPHONE HCL 2 MG PO TABS: 2.0000 mg | ORAL_TABLET | Freq: Four times a day (QID) | ORAL | 0 refills | Status: DC | PRN

## 2017-09-01 NOTE — Progress Notes (Signed)
ROB- Pt states she is still having that pelvic pain and pressure

## 2017-09-01 NOTE — Progress Notes (Signed)
Still noting pelvic pressure and pain. Missed pelvic flor physical therapy appointment due to her twins being [facility].  Will reschedule. Also not able to find a chiropractor that takes Medicaid.  Warm compresses and Tylenol ES not effective (notes taking almost every 3-4 hours due to pain). For 28 week labs today.  Desires to bottle feeding, considering BTL for contraception, signed tubal forms, but also given info on LARC. For Tdap today, signed blood consent, discussed cord blood banking. Discussed potential C-section date, considering 11/06/16 but will discuss with FOB.

## 2017-09-01 NOTE — Patient Instructions (Signed)
Pelvic Pain, Female °Pelvic pain is pain in your lower belly (abdomen), below your belly button and between your hips. The pain may start suddenly (acute), keep coming back (recurring), or last a long time (chronic). Pelvic pain that lasts longer than six months is considered chronic. There are many causes of pelvic pain. Sometimes the cause of your pelvic pain is not known. °Follow these instructions at home: °· Take over-the-counter and prescription medicines only as told by your doctor. °· Rest as told by your doctor. °· Do not have sex it if hurts. °· Keep a journal of your pelvic pain. Write down: °? When the pain started. °? Where the pain is located. °? What seems to make the pain better or worse, such as food or your menstrual cycle. °? Any symptoms you have along with the pain. °· Keep all follow-up visits as told by your doctor. This is important. °Contact a doctor if: °· Medicine does not help your pain. °· Your pain comes back. °· You have new symptoms. °· You have unusual vaginal discharge or bleeding. °· You have a fever or chills. °· You are having a hard time pooping (constipation). °· You have blood in your pee (urine) or poop (stool). °· Your pee smells bad. °· You feel weak or lightheaded. °Get help right away if: °· You have sudden pain that is very bad. °· Your pain continues to get worse. °· You have very bad pain and also have any of the following symptoms: °? A fever. °? Feeling stick to your stomach (nausea). °? Throwing up (vomiting). °? Being very sweaty. °· You pass out (lose consciousness). °This information is not intended to replace advice given to you by your health care provider. Make sure you discuss any questions you have with your health care provider. °Document Released: 02/08/2008 Document Revised: 09/16/2015 Document Reviewed: 06/12/2015 °Elsevier Interactive Patient Education © 2018 [employer] ° °

## 2017-09-02 LAB — CBC
Hematocrit: 37.1 % (ref 34.0–46.6)
Hemoglobin: 12.5 g/dL (ref 11.1–15.9)
MCH: 31.4 pg (ref 26.6–33.0)
MCHC: 33.7 g/dL (ref 31.5–35.7)
MCV: 93 fL (ref 79–97)
Platelets: 218 10*3/uL (ref 150–379)
RBC: 3.98 x10E6/uL (ref 3.77–5.28)
RDW: 12.6 % (ref 12.3–15.4)
WBC: 12.5 10*3/uL — ABNORMAL HIGH (ref 3.4–10.8)

## 2017-09-02 LAB — RPR: RPR Ser Ql: NONREACTIVE

## 2017-09-02 LAB — GLUCOSE, 1 HOUR GESTATIONAL: Gestational Diabetes Screen: 71 mg/dL (ref 65–139)

## 2017-09-03 NOTE — Progress Notes (Signed)
Please inform patient that her gestational diabetes screen is negative. We are also working on her medication that is requiring a prior authorization.

## 2017-09-06 ENCOUNTER — Ambulatory Visit: Payer: Medicaid Other

## 2017-09-07 ENCOUNTER — Other Ambulatory Visit: Payer: Self-pay | Admitting: Obstetrics and Gynecology

## 2017-09-07 MED ORDER — TRAMADOL HCL 50 MG PO TABS: 50.0000 mg | ORAL_TABLET | Freq: Four times a day (QID) | ORAL | 0 refills | Status: DC | PRN

## 2017-09-07 NOTE — Progress Notes (Signed)
Prior authorization required for Dilaudid, changed to Tramadol.

## 2017-09-27 ENCOUNTER — Ambulatory Visit (INDEPENDENT_AMBULATORY_CARE_PROVIDER_SITE_OTHER): Payer: Medicaid Other | Admitting: Obstetrics and Gynecology

## 2017-09-27 VITALS — BP 122/87 | HR 101 | Wt 143.9 lb

## 2017-09-27 DIAGNOSIS — Z3493 Encounter for supervision of normal pregnancy, unspecified, third trimester: Secondary | ICD-10-CM

## 2017-09-27 DIAGNOSIS — Z113 Encounter for screening for infections with a predominantly sexual mode of transmission: Secondary | ICD-10-CM

## 2017-09-27 DIAGNOSIS — O0993 Supervision of high risk pregnancy, unspecified, third trimester: Secondary | ICD-10-CM

## 2017-09-27 LAB — POCT URINALYSIS DIPSTICK
Bilirubin, UA: NEGATIVE
Blood, UA: NEGATIVE
Glucose, UA: NEGATIVE
Ketones, UA: NEGATIVE
Leukocytes, UA: NEGATIVE
Nitrite, UA: NEGATIVE
Protein, UA: NEGATIVE
Spec Grav, UA: 1.015 (ref 1.010–1.025)
Urobilinogen, UA: 0.2 E.U./dL
pH, UA: 6.5 (ref 5.0–8.0)

## 2017-09-27 NOTE — Addendum Note (Signed)
Addended by: Rosine BeatLONTZ, AMY L on: 09/27/2017 02:18 PM   Modules accepted: Orders

## 2017-09-27 NOTE — Progress Notes (Signed)
OB WORK IN- pt "thinks she may be leaking fluid", started yesterday

## 2017-09-27 NOTE — Progress Notes (Signed)
ROB: Patient presented today as an "emergency visit"because she says she has been leaking fluid all morning.  Reports it as intermittent.  Says that she got a pad wet.  She describes it as clear fluid. Speculum examination reveals no pooling no ability to produce fluid through the cervix with pressure.  Nitrazine negative fern negative (doubt rupture of membranes) Circular ulcerated lesions multiple noted on left labia.  The patient describes no history of HSV.  HSV swab performed.   U/A negative - C&S sent.   Warnings for the patient to return if she continues to leak discussed in detail.

## 2017-09-29 LAB — URINE CULTURE

## 2017-09-30 LAB — HERPES SIMPLEX VIRUS CULTURE

## 2017-10-03 ENCOUNTER — Ambulatory Visit (INDEPENDENT_AMBULATORY_CARE_PROVIDER_SITE_OTHER): Payer: Medicaid Other | Admitting: Obstetrics and Gynecology

## 2017-10-03 ENCOUNTER — Encounter: Payer: Self-pay | Admitting: Obstetrics and Gynecology

## 2017-10-03 VITALS — BP 127/79 | HR 109 | Wt 144.0 lb

## 2017-10-03 DIAGNOSIS — Z98891 History of uterine scar from previous surgery: Secondary | ICD-10-CM

## 2017-10-03 DIAGNOSIS — O09891 Supervision of other high risk pregnancies, first trimester: Secondary | ICD-10-CM

## 2017-10-03 DIAGNOSIS — Z8751 Personal history of pre-term labor: Secondary | ICD-10-CM

## 2017-10-03 DIAGNOSIS — O26899 Other specified pregnancy related conditions, unspecified trimester: Secondary | ICD-10-CM

## 2017-10-03 DIAGNOSIS — O0993 Supervision of high risk pregnancy, unspecified, third trimester: Secondary | ICD-10-CM

## 2017-10-03 DIAGNOSIS — R102 Pelvic and perineal pain unspecified side: Secondary | ICD-10-CM

## 2017-10-03 LAB — POCT URINALYSIS DIPSTICK
Bilirubin, UA: NEGATIVE
Blood, UA: NEGATIVE
Glucose, UA: NEGATIVE
Ketones, UA: NEGATIVE
Leukocytes, UA: NEGATIVE
Nitrite, UA: NEGATIVE
Protein, UA: NEGATIVE
Spec Grav, UA: 1.015 (ref 1.010–1.025)
Urobilinogen, UA: 0.2 E.U./dL
pH, UA: 8 (ref 5.0–8.0)

## 2017-10-03 NOTE — Progress Notes (Signed)
ROB: Patient concerned that lesion on left vulva/groin region may actually be ringworm. Discussed treatment options. HSV swab returned negative.  Discussed contraception again, patient no longer desires BTL but will consider LARC.  Given new handouts. Still noting pelvic pain. Advised once again on rescheduling pelvic floor physical therapy, will no longer prescribe any pain meds in 3rd trimester.

## 2017-10-03 NOTE — Progress Notes (Signed)
ROB- pt is doing well, states baby is very active

## 2017-10-18 ENCOUNTER — Encounter: Payer: Medicaid Other | Admitting: Obstetrics and Gynecology

## 2017-10-19 ENCOUNTER — Ambulatory Visit (INDEPENDENT_AMBULATORY_CARE_PROVIDER_SITE_OTHER): Payer: Medicaid Other | Admitting: Obstetrics and Gynecology

## 2017-10-19 ENCOUNTER — Encounter: Payer: Self-pay | Admitting: Obstetrics and Gynecology

## 2017-10-19 VITALS — BP 104/72 | HR 87 | Wt 147.3 lb

## 2017-10-19 DIAGNOSIS — Z8751 Personal history of pre-term labor: Secondary | ICD-10-CM

## 2017-10-19 DIAGNOSIS — Z98891 History of uterine scar from previous surgery: Secondary | ICD-10-CM

## 2017-10-19 DIAGNOSIS — O0993 Supervision of high risk pregnancy, unspecified, third trimester: Secondary | ICD-10-CM

## 2017-10-19 LAB — POCT URINALYSIS DIPSTICK
Bilirubin, UA: NEGATIVE
Blood, UA: NEGATIVE
Glucose, UA: NEGATIVE
Ketones, UA: NEGATIVE
Leukocytes, UA: NEGATIVE
Nitrite, UA: NEGATIVE
Protein, UA: NEGATIVE
Spec Grav, UA: 1.01 (ref 1.010–1.025)
Urobilinogen, UA: 0.2 E.U./dL
pH, UA: 7 (ref 5.0–8.0)

## 2017-10-19 NOTE — Progress Notes (Signed)
ROB: Overall doing ok, notes feeling nervous regarding C-section. Offered reassurance. Scheduled for 11/06/2017. For 36 week labs at that time. Still debating between LARC and BTL.   RTC in 1 week

## 2017-10-19 NOTE — Progress Notes (Signed)
Pt is doing well.

## 2017-10-27 ENCOUNTER — Encounter: Payer: Medicaid Other | Admitting: Obstetrics and Gynecology

## 2017-10-31 ENCOUNTER — Ambulatory Visit (INDEPENDENT_AMBULATORY_CARE_PROVIDER_SITE_OTHER): Payer: Medicaid Other | Admitting: Obstetrics and Gynecology

## 2017-10-31 VITALS — BP 114/64 | HR 98 | Wt 148.0 lb

## 2017-10-31 DIAGNOSIS — Z8759 Personal history of other complications of pregnancy, childbirth and the puerperium: Secondary | ICD-10-CM

## 2017-10-31 DIAGNOSIS — Z98891 History of uterine scar from previous surgery: Secondary | ICD-10-CM

## 2017-10-31 DIAGNOSIS — O09219 Supervision of pregnancy with history of pre-term labor, unspecified trimester: Secondary | ICD-10-CM

## 2017-10-31 DIAGNOSIS — O0993 Supervision of high risk pregnancy, unspecified, third trimester: Secondary | ICD-10-CM

## 2017-10-31 DIAGNOSIS — O09899 Supervision of other high risk pregnancies, unspecified trimester: Secondary | ICD-10-CM

## 2017-10-31 LAB — POCT URINALYSIS DIPSTICK
Bilirubin, UA: NEGATIVE
Blood, UA: NEGATIVE
Glucose, UA: NEGATIVE
Ketones, UA: 15
Leukocytes, UA: NEGATIVE
Nitrite, UA: NEGATIVE
Spec Grav, UA: 1.025 (ref 1.010–1.025)
Urobilinogen, UA: 0.2 E.U./dL
pH, UA: 6 (ref 5.0–8.0)

## 2017-10-31 NOTE — Progress Notes (Signed)
ROB: Patient overall doing ok.  Notes some nervousness regarding C-section.  Offered reassurance. Discussed process of scheduled C-section, advised on NPO after midnight on day prior to surgery. Pre-op done today.  Still undecided on BTL.

## 2017-10-31 NOTE — Progress Notes (Signed)
Pt is doing well.

## 2017-11-01 ENCOUNTER — Observation Stay
Admission: EM | Admit: 2017-11-01 | Discharge: 2017-11-01 | Disposition: A | Discharge status: Home / Self Care | Payer: Medicaid Other | Attending: Obstetrics and Gynecology | Admitting: Obstetrics and Gynecology

## 2017-11-01 DIAGNOSIS — Z3490 Encounter for supervision of normal pregnancy, unspecified, unspecified trimester: Secondary | ICD-10-CM

## 2017-11-01 DIAGNOSIS — O9989 Other specified diseases and conditions complicating pregnancy, childbirth and the puerperium: Secondary | ICD-10-CM | POA: Diagnosis present

## 2017-11-01 DIAGNOSIS — O471 False labor at or after 37 completed weeks of gestation: Secondary | ICD-10-CM | POA: Diagnosis not present

## 2017-11-01 DIAGNOSIS — Z3A37 37 weeks gestation of pregnancy: Secondary | ICD-10-CM | POA: Insufficient documentation

## 2017-11-01 DIAGNOSIS — O34219 Maternal care for unspecified type scar from previous cesarean delivery: Secondary | ICD-10-CM | POA: Insufficient documentation

## 2017-11-01 DIAGNOSIS — O26893 Other specified pregnancy related conditions, third trimester: Secondary | ICD-10-CM | POA: Diagnosis not present

## 2017-11-01 HISTORY — DX: Unspecified asthma, uncomplicated: J45.909

## 2017-11-01 MED ORDER — HYDROMORPHONE HCL 1 MG/ML IJ SOLN
1.0000 mg | Freq: Once | INTRAMUSCULAR | Status: AC
  Administered 2017-11-01: 1 mg via INTRAVENOUS

## 2017-11-01 MED ORDER — LACTATED RINGERS IV SOLN
INTRAVENOUS | Status: DC
  Administered 2017-11-01: 18:00:00 via INTRAVENOUS

## 2017-11-01 MED ORDER — SODIUM CHLORIDE FLUSH 0.9 % IV SOLN
INTRAVENOUS | Status: AC
  Administered 2017-11-01: 10 mL
  Filled 2017-11-01: qty 10

## 2017-11-01 MED ORDER — TERBUTALINE SULFATE 1 MG/ML IJ SOLN
INTRAMUSCULAR | Status: AC
  Administered 2017-11-01: 0.25 mg via SUBCUTANEOUS
  Filled 2017-11-01: qty 1

## 2017-11-01 MED ORDER — HYDROMORPHONE HCL 1 MG/ML IJ SOLN
INTRAMUSCULAR | Status: AC
  Administered 2017-11-01: 1 mg via INTRAVENOUS
  Filled 2017-11-01: qty 1

## 2017-11-01 MED ORDER — TERBUTALINE SULFATE 1 MG/ML IJ SOLN
0.2500 mg | Freq: Once | INTRAMUSCULAR | Status: AC
  Administered 2017-11-01: 0.25 mg via SUBCUTANEOUS

## 2017-11-01 MED ORDER — TERBUTALINE SULFATE 1 MG/ML IJ SOLN
INTRAMUSCULAR | Status: DC
Start: 2017-11-01 — End: 2017-11-02
  Filled 2017-11-01: qty 1

## 2017-11-01 MED ORDER — PROMETHAZINE HCL 25 MG/ML IJ SOLN
25.0000 mg | Freq: Once | INTRAMUSCULAR | Status: AC
  Administered 2017-11-01: 25 mg via INTRAVENOUS

## 2017-11-01 MED ORDER — LACTATED RINGERS IV BOLUS (SEPSIS)
1000.0000 mL | Freq: Once | INTRAVENOUS | Status: AC
  Administered 2017-11-01: 1000 mL via INTRAVENOUS

## 2017-11-01 MED ORDER — PROMETHAZINE HCL 25 MG/ML IJ SOLN
INTRAMUSCULAR | Status: AC
Start: 2017-11-01 — End: 2017-11-01
  Administered 2017-11-01: 25 mg via INTRAVENOUS
  Filled 2017-11-01: qty 1

## 2017-11-01 NOTE — OB Triage Note (Signed)
Patient presented to L&D with complaints of pelvic pressure and abdominal pain that started last night around 1900-2000. Denies vaginal bleeding or decreased fetal movement. Patient states she has been having some vaginal leaking, unsure of ROM.

## 2017-11-01 NOTE — Discharge Summary (Signed)
Patient discharge to home at 2140 after no signs of labor. Verbal order from Dr. Valentino Saxonherry to discharge patient if no contractions and cervical change present. Patient no longer complained of contractions; only of pressure, which she was reassured that pressure is normal and that unfortunately will not go away. Patient given Dilaudid 1mg  and phenergan 25mg  before discharge. Patient agreed with plan of care and discharge order. Patient appeared anxious due to coming up cesarean section. I reassured her and comfort her. All questions were answered. Patient discharge home via wheelchair and with significant other. Discharge instructions given along with at home comfort measures.

## 2017-11-02 LAB — STREP GP B NAA: Strep Gp B NAA: POSITIVE — AB

## 2017-11-03 ENCOUNTER — Encounter
Admission: RE | Admit: 2017-11-03 | Discharge: 2017-11-03 | Disposition: A | Discharge status: Home / Self Care | Payer: Medicaid Other | Source: Ambulatory Visit | Attending: Obstetrics and Gynecology | Admitting: Obstetrics and Gynecology

## 2017-11-03 ENCOUNTER — Other Ambulatory Visit: Payer: Self-pay

## 2017-11-03 DIAGNOSIS — Z0183 Encounter for blood typing: Secondary | ICD-10-CM | POA: Diagnosis not present

## 2017-11-03 DIAGNOSIS — Z01812 Encounter for preprocedural laboratory examination: Secondary | ICD-10-CM | POA: Diagnosis not present

## 2017-11-03 HISTORY — DX: Anxiety disorder, unspecified: F41.9

## 2017-11-03 HISTORY — DX: Gastro-esophageal reflux disease without esophagitis: K21.9

## 2017-11-03 LAB — CBC
HCT: 39.2 % (ref 35.0–47.0)
Hemoglobin: 13.6 g/dL (ref 12.0–16.0)
MCH: 31.7 pg (ref 26.0–34.0)
MCHC: 34.6 g/dL (ref 32.0–36.0)
MCV: 91.7 fL (ref 80.0–100.0)
Platelets: 188 10*3/uL (ref 150–440)
RBC: 4.27 MIL/uL (ref 3.80–5.20)
RDW: 12.9 % (ref 11.5–14.5)
WBC: 11.3 10*3/uL — ABNORMAL HIGH (ref 3.6–11.0)

## 2017-11-03 LAB — RAPID HIV SCREEN (HIV 1/2 AB+AG)
HIV 1/2 Antibodies: NONREACTIVE
HIV-1 P24 Antigen - HIV24: NONREACTIVE

## 2017-11-03 LAB — GC/CHLAMYDIA PROBE AMP
Chlamydia trachomatis, NAA: NEGATIVE
Neisseria gonorrhoeae by PCR: NEGATIVE

## 2017-11-03 LAB — TYPE AND SCREEN
ABO/RH(D): O POS
Antibody Screen: NEGATIVE
Extend sample reason: UNDETERMINED

## 2017-11-03 NOTE — Pre-Procedure Instructions (Signed)
Patient scheduled for cesarean section and bilateral tubal ligation.  Upon arrival to [facility] she stated that she does not plan on having a tubal but using Depo after surgery. Consent signed without the ordered tubal ligation. Also, patient having constant pressure. States she is drinking sufficiently. Had been in the ER 2 nights ago for same.

## 2017-11-03 NOTE — Patient Instructions (Signed)
Your procedure is scheduled on: November 06, 2017  Report to THE EMERGENCY ROOM AT 5:30 AM   Remember: Instructions that are not followed completely may result in serious  medical risk, up to and including death, or upon the discretion of your surgeon  and anesthesiologist your surgery may need to be rescheduled.     _X__ 1. Do not eat food after midnight the night before your procedure.                 No gum chewing or hard candies.                  You may drink clear liquids up to 2 hours                 before you are scheduled to arrive for your surgery-                  DO not drink clear liquids within 2 hours of the start of your surgery.                  Clear Liquids include:  Water   __X__2.  On the morning of surgery brush your teeth with toothpaste and water,                       you  may rinse your mouth with mouthwash if you wish.                             Do not swallow any toothpaste of mouthwash.     _X__ 3.  No Alcohol for 24 hours before or after surgery.   _X__ 4.  Do Not Smoke or use e-cigarettes For 24 Hours Prior to Your Surgery.                 Do not use any chewable tobacco products for at least 6 hours prior to                 surgery.  ____  5.  Bring all medications with you on the day of surgery if instructed.   ____  6.  Notify your doctor if there is any change in your medical condition      (cold, fever, infections).     Do not wear jewelry, make-up, hairpins, clips or nail polish. Do not wear lotions, powders, or perfumes. You may wear deodorant. Do not shave 48 hours prior to surgery. Men may shave face and neck. Do not bring valuables to the hospital.    [facility] is not responsible for any belongings or valuables.  Contacts, dentures or bridgework may not be worn into surgery. Leave your suitcase in the car. After surgery it may be brought to your room. For patients admitted to the hospital, discharge time is  determined by your treatment team.   Patients discharged the day of surgery will not be allowed to drive home.   Please read over the following fact sheets that you were given:   MEDICAL DIRECTIVES              PREPARING FOR SURGERY   ____ Take these medicines the morning of surgery with A SIP OF WATER:    1. BUSPAR  2. BRING ALBUTERAL TO HOSPITAL  3.   4.    ____ Fleet Enema (as directed)   __X__ Use CHG Soap as  directed  __X__ Use inhalers on the day of surgery AND BRING WITH YOU   ____ Stop ASPIRIN PRODUCTS NOW!!  ____ Stop Anti-inflammatories NOW!!             THIS INCLUDES IBUPROFEN / MOTRIN / ADVIL / ALEVE   ____ Stop supplements until after surgery.    ____ Bring C-Pap to the hospital.   HAVE STOOL SOFTENERS FOR HOME USE  HAVE A SMALL FIRM PILLOW FOR ABDOMINAL SUPPORT

## 2017-11-04 LAB — RPR: RPR Ser Ql: NONREACTIVE

## 2017-11-05 ENCOUNTER — Inpatient Hospital Stay: Payer: Medicaid Other | Admitting: Anesthesiology

## 2017-11-05 ENCOUNTER — Inpatient Hospital Stay
Admission: EM | Admit: 2017-11-05 | Discharge: 2017-11-07 | DRG: 788 | Disposition: A | Discharge status: Home / Self Care | Payer: Medicaid Other | Source: Ambulatory Visit | Attending: Obstetrics and Gynecology | Admitting: Obstetrics and Gynecology

## 2017-11-05 ENCOUNTER — Other Ambulatory Visit: Payer: Self-pay

## 2017-11-05 ENCOUNTER — Encounter
Admission: EM | Disposition: A | Discharge status: Home / Self Care | Payer: Self-pay | Source: Ambulatory Visit | Attending: Obstetrics and Gynecology

## 2017-11-05 DIAGNOSIS — O9952 Diseases of the respiratory system complicating childbirth: Secondary | ICD-10-CM | POA: Diagnosis present

## 2017-11-05 DIAGNOSIS — Z3A37 37 weeks gestation of pregnancy: Secondary | ICD-10-CM

## 2017-11-05 DIAGNOSIS — O34212 Maternal care for vertical scar from previous cesarean delivery: Secondary | ICD-10-CM | POA: Diagnosis not present

## 2017-11-05 DIAGNOSIS — O99824 Streptococcus B carrier state complicating childbirth: Secondary | ICD-10-CM | POA: Diagnosis present

## 2017-11-05 DIAGNOSIS — J45909 Unspecified asthma, uncomplicated: Secondary | ICD-10-CM | POA: Diagnosis present

## 2017-11-05 DIAGNOSIS — K219 Gastro-esophageal reflux disease without esophagitis: Secondary | ICD-10-CM | POA: Diagnosis present

## 2017-11-05 DIAGNOSIS — O9962 Diseases of the digestive system complicating childbirth: Secondary | ICD-10-CM | POA: Diagnosis present

## 2017-11-05 DIAGNOSIS — Z87891 Personal history of nicotine dependence: Secondary | ICD-10-CM | POA: Diagnosis not present

## 2017-11-05 LAB — URINE DRUG SCREEN, QUALITATIVE (ARMC ONLY)
Amphetamines, Ur Screen: NOT DETECTED
Barbiturates, Ur Screen: NOT DETECTED
Benzodiazepine, Ur Scrn: POSITIVE — AB
Cannabinoid 50 Ng, Ur ~~LOC~~: POSITIVE — AB
Cocaine Metabolite,Ur ~~LOC~~: NOT DETECTED
MDMA (Ecstasy)Ur Screen: NOT DETECTED
Methadone Scn, Ur: NOT DETECTED
Opiate, Ur Screen: NOT DETECTED
Phencyclidine (PCP) Ur S: NOT DETECTED
Tricyclic, Ur Screen: NOT DETECTED

## 2017-11-05 SURGERY — Surgical Case
Anesthesia: Spinal

## 2017-11-05 MED ORDER — SIMETHICONE 80 MG PO CHEW
80.0000 mg | CHEWABLE_TABLET | Freq: Four times a day (QID) | ORAL | Status: DC
  Administered 2017-11-05 – 2017-11-07 (×8): 80 mg via ORAL
  Filled 2017-11-05 (×8): qty 1

## 2017-11-05 MED ORDER — NALBUPHINE HCL 10 MG/ML IJ SOLN: 5.0000 mg | INTRAMUSCULAR | Status: DC | PRN

## 2017-11-05 MED ORDER — NALOXONE HCL 4 MG/10ML IJ SOLN
1.0000 ug/kg/h | INTRAVENOUS | Status: DC | PRN
  Filled 2017-11-05: qty 5

## 2017-11-05 MED ORDER — ZOLPIDEM TARTRATE 5 MG PO TABS: 5.0000 mg | ORAL_TABLET | Freq: Every evening | ORAL | Status: DC | PRN

## 2017-11-05 MED ORDER — IBUPROFEN 600 MG PO TABS
600.0000 mg | ORAL_TABLET | Freq: Four times a day (QID) | ORAL | Status: DC
  Administered 2017-11-06 – 2017-11-07 (×7): 600 mg via ORAL
  Filled 2017-11-05 (×7): qty 1

## 2017-11-05 MED ORDER — MORPHINE SULFATE (PF) 0.5 MG/ML IJ SOLN
INTRAMUSCULAR | Status: AC
  Filled 2017-11-05: qty 10

## 2017-11-05 MED ORDER — OXYTOCIN 40 UNITS IN LACTATED RINGERS INFUSION - SIMPLE MED
INTRAVENOUS | Status: DC | PRN
  Administered 2017-11-05: 500 mL via INTRAVENOUS

## 2017-11-05 MED ORDER — ACETAMINOPHEN 500 MG PO TABS
1000.0000 mg | ORAL_TABLET | Freq: Four times a day (QID) | ORAL | Status: DC
  Administered 2017-11-05: 1000 mg via ORAL
  Filled 2017-11-05: qty 2

## 2017-11-05 MED ORDER — DIPHENHYDRAMINE HCL 50 MG/ML IJ SOLN: 12.5000 mg | INTRAMUSCULAR | Status: DC | PRN

## 2017-11-05 MED ORDER — SODIUM CHLORIDE 0.9% FLUSH: 3.0000 mL | INTRAVENOUS | Status: DC | PRN

## 2017-11-05 MED ORDER — ACETAMINOPHEN 325 MG PO TABS: 650.0000 mg | ORAL_TABLET | ORAL | Status: DC | PRN

## 2017-11-05 MED ORDER — PHENYLEPHRINE HCL 10 MG/ML IJ SOLN
INTRAMUSCULAR | Status: DC | PRN
  Administered 2017-11-05: 100 ug via INTRAVENOUS

## 2017-11-05 MED ORDER — LACTATED RINGERS IV SOLN: INTRAVENOUS | Status: DC

## 2017-11-05 MED ORDER — SOD CITRATE-CITRIC ACID 500-334 MG/5ML PO SOLN
30.0000 mL | ORAL | Status: DC
  Administered 2017-11-05: 11:00:00 via ORAL

## 2017-11-05 MED ORDER — OXYCODONE-ACETAMINOPHEN 5-325 MG PO TABS
2.0000 | ORAL_TABLET | ORAL | Status: DC | PRN
  Administered 2017-11-05 (×2): 2 via ORAL
  Administered 2017-11-06: 1 via ORAL
  Administered 2017-11-06 – 2017-11-07 (×9): 2 via ORAL
  Filled 2017-11-05 (×12): qty 2

## 2017-11-05 MED ORDER — PROPOFOL 10 MG/ML IV BOLUS
INTRAVENOUS | Status: AC
  Filled 2017-11-05: qty 20

## 2017-11-05 MED ORDER — OXYTOCIN 40 UNITS IN LACTATED RINGERS INFUSION - SIMPLE MED
2.5000 [IU]/h | INTRAVENOUS | Status: DC
  Administered 2017-11-05: 2.5 [IU]/h via INTRAVENOUS

## 2017-11-05 MED ORDER — NALBUPHINE HCL 10 MG/ML IJ SOLN: 5.0000 mg | Freq: Once | INTRAMUSCULAR | Status: DC | PRN

## 2017-11-05 MED ORDER — NALOXONE HCL 0.4 MG/ML IJ SOLN: 0.4000 mg | INTRAMUSCULAR | Status: DC | PRN

## 2017-11-05 MED ORDER — SODIUM CHLORIDE 0.9 % IV SOLN
INTRAVENOUS | Status: DC | PRN
  Administered 2017-11-05: 30 ug/min via INTRAVENOUS

## 2017-11-05 MED ORDER — SOD CITRATE-CITRIC ACID 500-334 MG/5ML PO SOLN
ORAL | Status: AC
  Filled 2017-11-05: qty 15

## 2017-11-05 MED ORDER — MEPERIDINE HCL 25 MG/ML IJ SOLN: 6.2500 mg | INTRAMUSCULAR | Status: DC | PRN

## 2017-11-05 MED ORDER — MORPHINE SULFATE (PF) 0.5 MG/ML IJ SOLN
INTRAMUSCULAR | Status: DC | PRN
  Administered 2017-11-05: .1 mg via EPIDURAL

## 2017-11-05 MED ORDER — SENNOSIDES-DOCUSATE SODIUM 8.6-50 MG PO TABS
2.0000 | ORAL_TABLET | ORAL | Status: DC
  Administered 2017-11-06 – 2017-11-07 (×2): 2 via ORAL
  Filled 2017-11-05 (×2): qty 2

## 2017-11-05 MED ORDER — GENTAMICIN SULFATE 40 MG/ML IJ SOLN
INTRAVENOUS | Status: AC
  Administered 2017-11-05: 11:00:00 via INTRAVENOUS
  Filled 2017-11-05: qty 8.5

## 2017-11-05 MED ORDER — MIDAZOLAM HCL 2 MG/2ML IJ SOLN
INTRAMUSCULAR | Status: AC
  Filled 2017-11-05: qty 2

## 2017-11-05 MED ORDER — PRENATAL MULTIVITAMIN CH
1.0000 | ORAL_TABLET | Freq: Every day | ORAL | Status: DC
  Administered 2017-11-06 – 2017-11-07 (×2): 1 via ORAL
  Filled 2017-11-05 (×2): qty 1

## 2017-11-05 MED ORDER — LACTATED RINGERS IV SOLN
INTRAVENOUS | Status: DC
  Administered 2017-11-05 (×2): via INTRAVENOUS

## 2017-11-05 MED ORDER — FENTANYL CITRATE (PF) 100 MCG/2ML IJ SOLN
25.0000 ug | INTRAMUSCULAR | Status: DC | PRN
  Administered 2017-11-05 (×4): 25 ug via INTRAVENOUS
  Filled 2017-11-05: qty 2

## 2017-11-05 MED ORDER — ONDANSETRON HCL 4 MG/2ML IJ SOLN: 4.0000 mg | Freq: Once | INTRAMUSCULAR | Status: DC | PRN

## 2017-11-05 MED ORDER — OXYCODONE-ACETAMINOPHEN 5-325 MG PO TABS: 1.0000 | ORAL_TABLET | ORAL | Status: DC | PRN

## 2017-11-05 MED ORDER — KETOROLAC TROMETHAMINE 30 MG/ML IJ SOLN: 30.0000 mg | Freq: Four times a day (QID) | INTRAMUSCULAR | Status: DC | PRN

## 2017-11-05 MED ORDER — KETOROLAC TROMETHAMINE 30 MG/ML IJ SOLN
30.0000 mg | Freq: Four times a day (QID) | INTRAMUSCULAR | Status: DC | PRN
  Administered 2017-11-05 (×2): 30 mg via INTRAVENOUS
  Filled 2017-11-05 (×2): qty 1

## 2017-11-05 MED ORDER — LIDOCAINE 5 % EX PTCH
MEDICATED_PATCH | CUTANEOUS | Status: DC | PRN
  Administered 2017-11-05: 1 via TRANSDERMAL

## 2017-11-05 MED ORDER — BUPIVACAINE IN DEXTROSE 0.75-8.25 % IT SOLN
INTRATHECAL | Status: DC | PRN
  Administered 2017-11-05: 1.5 mL via INTRATHECAL

## 2017-11-05 MED ORDER — MIDAZOLAM HCL 5 MG/5ML IJ SOLN
INTRAMUSCULAR | Status: DC | PRN
  Administered 2017-11-05 (×2): 2 mg via INTRAVENOUS

## 2017-11-05 MED ORDER — MENTHOL 3 MG MT LOZG
1.0000 | LOZENGE | OROMUCOSAL | Status: DC | PRN
  Filled 2017-11-05: qty 9

## 2017-11-05 MED ORDER — DIPHENHYDRAMINE HCL 25 MG PO CAPS: 25.0000 mg | ORAL_CAPSULE | ORAL | Status: DC | PRN

## 2017-11-05 MED ORDER — ONDANSETRON HCL 4 MG/2ML IJ SOLN
INTRAMUSCULAR | Status: DC | PRN
  Administered 2017-11-05: 4 mg via INTRAVENOUS

## 2017-11-05 MED ORDER — DIPHENHYDRAMINE HCL 25 MG PO CAPS: 25.0000 mg | ORAL_CAPSULE | Freq: Four times a day (QID) | ORAL | Status: DC | PRN

## 2017-11-05 MED ORDER — LIDOCAINE 5 % EX PTCH
MEDICATED_PATCH | CUTANEOUS | Status: AC
  Filled 2017-11-05: qty 1

## 2017-11-05 SURGICAL SUPPLY — 24 items
ADHESIVE MASTISOL STRL (MISCELLANEOUS) ×3 IMPLANT
BAG COUNTER SPONGE EZ (MISCELLANEOUS) ×2 IMPLANT
BENZOIN TINCTURE PRP APPL 2/3 (GAUZE/BANDAGES/DRESSINGS) ×3 IMPLANT
CANISTER SUCT 3000ML PPV (MISCELLANEOUS) ×3 IMPLANT
CELL SAVER LIPIGURD (MISCELLANEOUS) ×1 IMPLANT
CHLORAPREP W/TINT 26ML (MISCELLANEOUS) ×6 IMPLANT
COUNTER SPONGE BAG EZ (MISCELLANEOUS) ×1
DRSG TELFA 3X8 NADH (GAUZE/BANDAGES/DRESSINGS) ×3 IMPLANT
EXTRT SYSTEM ALEXIS 14CM (MISCELLANEOUS) ×3
GAUZE SPONGE 4X4 12PLY STRL (GAUZE/BANDAGES/DRESSINGS) ×3 IMPLANT
GLOVE BIOGEL PI ORTHO PRO 7.5 (GLOVE) ×10
GLOVE PI ORTHO PRO STRL 7.5 (GLOVE) ×5 IMPLANT
GOWN STRL REUS W/ TWL LRG LVL3 (GOWN DISPOSABLE) ×2 IMPLANT
GOWN STRL REUS W/TWL LRG LVL3 (GOWN DISPOSABLE) ×4
KIT TURNOVER KIT A (KITS) ×3 IMPLANT
NS IRRIG 1000ML POUR BTL (IV SOLUTION) ×3 IMPLANT
PACK C SECTION AR (MISCELLANEOUS) ×3 IMPLANT
PAD OB MATERNITY 4.3X12.25 (PERSONAL CARE ITEMS) ×3 IMPLANT
PAD PREP 24X41 OB/GYN DISP (PERSONAL CARE ITEMS) ×3 IMPLANT
SPONGE LAP 18X18 5 PK (GAUZE/BANDAGES/DRESSINGS) ×3 IMPLANT
SUT VIC AB 0 CTX 36 (SUTURE) ×4
SUT VIC AB 0 CTX36XBRD ANBCTRL (SUTURE) ×2 IMPLANT
SUT VIC AB 1 CT1 36 (SUTURE) ×6 IMPLANT
SUT VICRYL+ 3-0 36IN CT-1 (SUTURE) ×6 IMPLANT

## 2017-11-05 NOTE — OB Triage Note (Signed)
Pt. Came into ED with contraction pain every couple minutes. Patient states that the pain started this morning at about 0830. No leaking of fluid or discharge. Cervical exam performed and patient is 6 cm dilated and 100 effaced.

## 2017-11-05 NOTE — Anesthesia Preprocedure Evaluation (Signed)
Anesthesia Evaluation  Patient identified by MRN, date of birth, ID band Patient awake    Reviewed: Allergy & Precautions, NPO status , Patient's Chart, lab work & pertinent test results  History of Anesthesia Complications Negative for: history of anesthetic complications  Airway Mallampati: II       Dental   Pulmonary asthma , neg sleep apnea, neg pneumonia , Current Smoker,           Cardiovascular (-) hypertension(-) Past MI and (-) CHF (-) dysrhythmias (-) Valvular Problems/Murmurs     Neuro/Psych neg Seizures Anxiety    GI/Hepatic Neg liver ROS, GERD (with pregnancy)  ,  Endo/Other  negative endocrine ROSneg diabetes  Renal/GU negative Renal ROS     Musculoskeletal   Abdominal   Peds  Hematology   Anesthesia Other Findings   Reproductive/Obstetrics                             Anesthesia Physical Anesthesia Plan  ASA: II and emergent  Anesthesia Plan: Spinal   Post-op Pain Management:    Induction:   PONV Risk Score and Plan:   Airway Management Planned:   Additional Equipment:   Intra-op Plan:   Post-operative Plan:   Informed Consent: I have reviewed the patients History and Physical, chart, labs and discussed the procedure including the risks, benefits and alternatives for the proposed anesthesia with the patient or authorized representative who has indicated his/her understanding and acceptance.     Plan Discussed with:   Anesthesia Plan Comments:         Anesthesia Quick Evaluation

## 2017-11-05 NOTE — Op Note (Signed)
      OP NOTE  Date: 11/05/2017   12:36 PM Name Christeena Danella MaiersM Mott MR# [id number]  Preoperative Diagnosis: 1. Intrauterine pregnancy at 4929w4d Active Problems:   Indication for care in labor or delivery  2.  previous uterine incision classical  3.  Labor with advanced cervical dilation  Postoperative Diagnosis: 1. Intrauterine pregnancy at 6429w4d, delivered 2. Viable infant 3. Remainder same as pre-op   Procedure: 1. Reopeat CD-   Via Low-Transverse Cesarean Section  Surgeon: Elonda Huskyavid J. Evans, MD  Assistant:  Jeralyn BennettLawhorn  Anesthesia: Spinal    EBL: 850  ml     Findings: 1) female infant, Apgar scores of 8    at 1 minute and 9    at 5 minutes and a birthweight of 101.59  ounces.    2) Normal uterus, tubes and ovaries.    Procedure:  The patient was prepped and draped in the supine position and placed under spinal anesthesia.  A transverse incision was made across the abdomen in a Pfannenstiel manner. If indicated the old scar was systematically removed with sharp dissection.  We carried the dissection down to the level of the fascia.  The fascia was incised in a curvilinear manner.  The fascia was then elevated from the rectus muscles with blunt and sharp dissection.  The rectus muscles were separated laterally exposing the peritoneum.  The peritoneum was carefully entered with care being taken to avoid bowel and bladder.  A self-retaining retractor was placed.  The visceral peritoneum was incised in a curvilinear fashion across the lower uterine segment creating a bladder flap. A transverse incision was made across the lower uterine segment and extended laterally and superiorly using the bandage scissors.  Artificial rupture membranes was performed and Meconium fluid was noted.  The infant was delivered from the cephalic position.  A nuchal cord was not present. The cord was doubly clamped and cut. Cord blood was obtained if appropriate.  The infant was handed to the pediatric personnel  who  then placed the infant under heat lamps where it was cleaned dried and re-suctioned. The placenta was delivered. The hysterotomy incision was then identified on ring forceps.  The uterine cavity was cleaned with a moist lap sponge.  The hysterotomy incision was closed with a running interlocking suture of Vicryl.  Hemostasis was excellent.  Pitocin was run in the IV and the uterus was found to be firm. The posterior cul-de-sac and gutters were cleaned and inspected.  Hemostasis was noted.  The fascia was then closed with a running suture of #1 Vicryl.  Hemostasis of the subcutaneous tissues was obtained using the Bovie.  The subcutaneous tissues were closed with a running suture of 000 Vicryl.  A subcuticular suture was placed.  Steri-Strips were applied in the usual manner.  A pressure dressing was placed.  The patient went to the recovery room in stable condition.   Elonda Huskyavid J. Evans, M.D. 11/05/2017 12:36 PM

## 2017-11-05 NOTE — Progress Notes (Signed)
Spoke to MD. Per Dr. Henrene HawkingKephart, ok to give 2 percocet PO for increased pain.

## 2017-11-05 NOTE — Progress Notes (Signed)
Pt assessed at 1 hr after admission. Continues to complain of cramping and pain. Pain med available at 7pm, pt aware. Noted with moderate amount of bleeding, slight increase from previous assessment, small trickle of bleeding noted with movement, stopped after pad changed. Uterus firm and U/1. Paged charge nurse to verify bleeding. Zella Ballobin, RN at bedside confirmed. No further bleeding after pad changed and repositioned. Will reassess in 1 hr. Pt informed of when to call if changes noted.

## 2017-11-05 NOTE — Interval H&P Note (Signed)
History and Physical Interval Note:  11/05/2017 11:22 AM  Kirsten Wang  has presented today for surgery, with the diagnosis of previous c section  The various methods of treatment have been discussed with the patient and family. After consideration of risks, benefits and other options for treatment, the patient has consented to  Procedure(s): CESAREAN SECTION (N/A) as a surgical intervention .  The patient's history has been reviewed, patient examined, no change in status, stable for surgery.  I have reviewed the patient's chart and labs.  Questions were answered to the patient's satisfaction.     Brennan Baileyavid Ura Yingling

## 2017-11-05 NOTE — Anesthesia Procedure Notes (Signed)
Spinal  Patient location during procedure: OR Start time: 11/05/2017 11:15 AM End time: 11/05/2017 11:20 AM Staffing Resident/CRNA: Nelda Marseille, CRNA Performed: resident/CRNA  Preanesthetic Checklist Completed: patient identified, site marked, surgical consent, pre-op evaluation, timeout performed, IV checked, risks and benefits discussed and monitors and equipment checked Spinal Block Patient position: sitting Prep: Betadine Patient monitoring: heart rate, continuous pulse ox, blood pressure and cardiac monitor Approach: midline Location: L3-4 Injection technique: single-shot Needle Needle type: Whitacre and Introducer  Needle gauge: 25 G Needle length: 9 cm Assessment Sensory level: T4 Additional Notes Negative paresthesia. Negative blood return. Positive free-flowing CSF. Expiration date of kit checked and confirmed. Patient tolerated procedure well, without complications.

## 2017-11-05 NOTE — Anesthesia Procedure Notes (Signed)
Date/Time: 11/05/2017 11:36 AM Performed by: Junious SilkNoles, Mitzi Lilja, CRNA Pre-anesthesia Checklist: Patient identified, Emergency Drugs available, Suction available, Patient being monitored and Timeout performed Oxygen Delivery Method: Nasal cannula

## 2017-11-05 NOTE — Lactation Note (Signed)
This note was copied from a baby's chart. Lactation Consultation Note Discussed with mom in private that she had tested (+) for marijuana and benzodiazepines. Cord blood has been sent and urine bag is on baby to send urine for drug testing. Mom seemed surprised.  She reports she has been taking medications for anxiety and depression on and off.  Discussed risks of breast feeding and continuing to take marijuana to NeshanicJosiah and her.  Mom agrees not to do marijuana and does not feel like she needs medications for anxiety and depression but will not breast feed if she does.  Mom really wants to breast feed which she feels is the right thing for Jackelyn HoehnJosiah.  She said she would have breast fed the twins if they had not been born premature at 23 weeks, but she did pump with FOB's assistance for 3 months for twins.  Told her AAP's and lactation's recommendations to pump and dump as an alternative.  Mom still insists on putting Jackelyn HoehnJosiah to the breast for now but may want to give formula later.  Discussed supply and demand, routine newborn feeding patterns and normal course of lactation.  Jackelyn HoehnJosiah was extremely fussy and agitated while we were talking.  Assisted mom with putting Jackelyn HoehnJosiah to the breast for this feeding and told her we would discuss in more detail after test results on baby came back. Jackelyn HoehnJosiah calmed right down when put to breast and breast fed well with good rhythmic sucking and occasional swallow. Encouraged mom to call lactation for questions, concerns or assistance.     Patient Name: Boy Donneisha Morrie Sheldonshley AVWUJ'WToday's Date: 11/05/2017 Reason for consult: Initial assessment;1st time breastfeeding;Early term 37-38.6wks;Other (Comment)(Mom (+) for MJ & Benzodiazapine (takes med for Depression))   Maternal Data Formula Feeding for Exclusion: No Has patient been taught Hand Expression?: Yes(Can hand express colostrum) Does the patient have breastfeeding experience prior to this delivery?: Yes  Feeding Feeding Type: Breast  Fed Length of feed: 20 min  LATCH Score Latch: Repeated attempts needed to sustain latch, nipple held in mouth throughout feeding, stimulation needed to elicit sucking reflex.  Audible Swallowing: A few with stimulation  Type of Nipple: Everted at rest and after stimulation  Comfort (Breast/Nipple): Soft / non-tender  Hold (Positioning): Assistance needed to correctly position infant at breast and maintain latch.  LATCH Score: 7  Interventions Interventions: Breast feeding basics reviewed;Assisted with latch;Skin to skin;Breast massage;Hand express;Breast compression;Adjust position;Support pillows;Position options  Lactation Tools Discussed/Used WIC Program: Yes   Consult Status Consult Status: Follow-up Date: 11/05/17 Follow-up type: Call as needed    Louis MeckelWilliams, Deniel Mcquiston Kay 11/05/2017, 3:06 PM

## 2017-11-05 NOTE — Anesthesia Post-op Follow-up Note (Signed)
Anesthesia QCDR form completed.        

## 2017-11-05 NOTE — H&P (Signed)
History and Physical   HPI  Kirsten Wang is a 27 y.o. [id number] at [redacted]w[redacted]d Estimated Date of Delivery: 11/22/17 who is being admitted for  C-section.  H/O classical CD.  Now at 37 weeks.  C/o contractions all morning.     OB History  Obstetric History   G3   P2   T1   P1   A0   L3    SAB0   TAB0   Ectopic0   Multiple1   Live Births3     # Outcome Date GA Lbr Len/2nd Weight Sex Delivery Anes PTL Lv  3 Current           2A Preterm 06/2016 [redacted]w[redacted]d  1 lb 3 oz (0.539 kg) M CS-Unspec  Y LIV  2B Preterm 06/2016 [redacted]w[redacted]d  1 lb 5 oz (0.595 kg) M   Y LIV  1 Term 03/01/07 [redacted]w[redacted]d  7 lb 12 oz (3.515 kg) F Vag-Spont  N LIV    Obstetric Comments  G2 - pregnancy complicated by preterm labor requiring delivery at 23 weeks, classical C-section    PROBLEM LIST  Pregnancy complications or risks: Patient Active Problem List   Diagnosis Date Noted  . Labor and delivery indication for care or intervention 11/01/2017  . Pregnancy 11/01/2017  . History of twin pregnancy in prior pregnancy 10/31/2017  . History of preterm delivery 05/13/2017  . Anxiety and depression 05/13/2017  . Tobacco abuse 05/13/2017  . Short interval between pregnancies affecting pregnancy in first trimester, antepartum 05/13/2017  . History of cesarean section, classical 05/11/2017  . Supervision of high risk pregnancy, antepartum, first trimester 05/11/2017  . Preterm labor in second trimester 06/11/2016  . Low back pain during pregnancy 06/09/2016  . Pelvic pain in pregnancy 06/06/2016  . Indication for care in labor and delivery, antepartum 05/17/2016    Prenatal labs and studies: ABO, Rh: --/--/O POS (03/01 1210) Antibody: NEG (03/01 1210) Rubella: 4.02 (09/06 1224) RPR: Non Reactive (03/01 1210)  HBsAg: Negative (09/06 1224)  HIV: NON REACTIVE (03/01 1210)  AVW:UJWJXBJY (02/26 1540)   Past Medical History:  Diagnosis Date  . Anxiety   . Asthma   . Atresia and stenosis of large intestine, rectum, and anal  canal, congenital 1993  . GERD (gastroesophageal reflux disease)    HEARTBURN WITH PREGNANCY  . Vaginal Pap smear, abnormal      Past Surgical History:  Procedure Laterality Date  . ABDOMINAL SURGERY    . CESAREAN SECTION  06/14/2016   twins  . COLON SURGERY  1991-11-03  . klonicatresia       Medications    Current Discharge Medication List    CONTINUE these medications which have NOT CHANGED   Details  acetaminophen (TYLENOL) 500 MG tablet Take 500 mg by mouth every 6 (six) hours as needed for mild pain or headache.     albuterol (PROVENTIL HFA;VENTOLIN HFA) 108 (90 Base) MCG/ACT inhaler Inhale 2 puffs into the lungs every 6 (six) hours as needed for wheezing or shortness of breath.     busPIRone (BUSPAR) 10 MG tablet Take 10 mg by mouth 2 (two) times daily.    calcium carbonate (TUMS - DOSED IN MG ELEMENTAL CALCIUM) 500 MG chewable tablet Chew 2 tablets by mouth 3 (three) times daily as needed for indigestion or heartburn.    Potassium 99 MG TABS Take 99 mg by mouth daily.    Prenatal-DSS-FeCb-FeGl-FA (CITRANATAL BLOOM) 90-1 MG TABS Take 90 mg by mouth daily. Qty:  30 tablet, Refills: 11   Associated Diagnoses: Pregnancy, supervision, high-risk, first trimester         Allergies  Cephalosporins and Codeine  Review of Systems  Pertinent items are noted in HPI.  Physical Exam  BP 129/77   Pulse 90   Temp 98.9 F (37.2 C)   Resp 18   LMP  (LMP Unknown)   Lungs:  CTA B Cardio: RRR without M/R/G Abd: Soft, gravid, NT Presentation: cephalic EXT: No C/C/ 1+ Edema DTRs: 2+ B CERVIX: 6cm by NE  :  100%:     See Prenatal records for more detailed PE.     FHR:  Variability: Good {> 6 bpm)  Toco: Uterine Contractions: regular painful.   Test Results  No results found for this or any previous visit (from the past 24 hour(s)). Urine tox screen POS for Benzos  Assessment   G3P1103 at 5358w4d Estimated Date of Delivery: 11/22/17  The fetus is  reassuring.   Patient Active Problem List   Diagnosis Date Noted  . Labor and delivery indication for care or intervention 11/01/2017  . Pregnancy 11/01/2017  . History of twin pregnancy in prior pregnancy 10/31/2017  . History of preterm delivery 05/13/2017  . Anxiety and depression 05/13/2017  . Tobacco abuse 05/13/2017  . Short interval between pregnancies affecting pregnancy in first trimester, antepartum 05/13/2017  . History of cesarean section, classical 05/11/2017  . Supervision of high risk pregnancy, antepartum, first trimester 05/11/2017  . Preterm labor in second trimester 06/11/2016  . Low back pain during pregnancy 06/09/2016  . Pelvic pain in pregnancy 06/06/2016  . Indication for care in labor and delivery, antepartum 05/17/2016    Plan  1. Admit to L&D :   plan Cesarean delivery 2. EFM: -- Category 1 3. Spinal 4. Admission labs    Elonda Huskyavid J. Ryanne Morand, M.D. 11/05/2017 11:19 AM

## 2017-11-05 NOTE — Transfer of Care (Signed)
Immediate Anesthesia Transfer of Care Note  Patient: Kirsten Wang  Procedure(s) Performed: CESAREAN SECTION (N/A )  Patient Location: PACU  Anesthesia Type:Spinal  Level of Consciousness: awake, alert  and oriented  Airway & Oxygen Therapy: Patient Spontanous Breathing and Patient connected to nasal cannula oxygen  Post-op Assessment: Report given to RN and Post -op Vital signs reviewed and stable  Post vital signs: Reviewed and stable  Last Vitals:  Vitals:   11/05/17 1041  BP: 129/77  Pulse: 90  Resp: 18  Temp: 37.2 C    Last Pain:  Vitals:   11/05/17 1038  PainSc: 10-Worst pain ever      Patients Stated Pain Goal: 0 (11/05/17 1038)  Complications: No apparent anesthesia complications

## 2017-11-06 ENCOUNTER — Inpatient Hospital Stay
Admission: RE | Admit: 2017-11-06 | Payer: Medicaid Other | Source: Ambulatory Visit | Admitting: Obstetrics and Gynecology

## 2017-11-06 ENCOUNTER — Encounter: Payer: Self-pay | Admitting: Obstetrics and Gynecology

## 2017-11-06 ENCOUNTER — Encounter: Admission: RE | Payer: Self-pay | Source: Ambulatory Visit

## 2017-11-06 SURGERY — Surgical Case
Anesthesia: Spinal

## 2017-11-06 MED ORDER — COCONUT OIL OIL: 1.0000 "application " | TOPICAL_OIL | Status: DC | PRN

## 2017-11-06 NOTE — Progress Notes (Signed)
Patient ID: Kirsten Wang, female   DOB: 08/05/1991, 27 y.o.   MRN: [id number]     Progress Note - Cesarean Delivery  Kirsten Wang is a 27 y.o. [id number] now PP day 1 s/p C-Section, Low Transverse .   Subjective:  Patient reports no problems with eating, bowel movements, voiding, or their wound  Objective:  Vital signs in last 24 hours: Temp:  [97.3 F (36.3 C)-98.9 F (37.2 C)] 97.6 F (36.4 C) (03/04 0410) Pulse Rate:  [53-135] 71 (03/04 0410) Resp:  [10-40] 20 (03/04 0410) BP: (85-152)/(43-92) 112/51 (03/04 0410) SpO2:  [95 %-100 %] 100 % (03/04 0410) Weight:  [148 lb (67.1 kg)] 148 lb (67.1 kg) (03/03 1910)  Physical Exam:  General: alert, cooperative, fatigued and no distress Lochia: appropriate Uterine Fundus: firm Incision: dressing intact DVT Evaluation: No evidence of DVT seen on physical exam.    Data Review Recent Labs    11/03/17 1210  HGB 13.6  HCT 39.2    Assessment:  Active Problems:   Indication for care in labor or delivery   Status post Cesarean section. Doing well postoperatively.     Plan:       Continue current care.    Elonda Huskyavid J. Sylar Voong, M.D. 11/06/2017 9:16 AM

## 2017-11-06 NOTE — Final Progress Note (Signed)
L&D OB Triage Note  Kirsten Wang is a 27 y.o. [id number] female at 4715w2d, EDD Estimated Date of Delivery: 11/22/17 who presented to triage for complaints of contractions ongoing since yesterday around 7-8 pm.  She has a h/o prior C-section x 1 (classical), scheduled for repeat C-section next week. She was evaluated by the nurses with significant findings for preterm contractions, but was eventually ruled out for preterm labor. Vital signs stable. An NST was performed and has been reviewed by MD. She was treated with IV fluids, 2 doses of terbutaline, and IV Dilaudid for pain.    NST INTERPRETATION: Indications: rule out uterine contractions  Mode: External Baseline Rate (A): 127 bpm(fht) Variability: Moderate Accelerations: 10 x 10 Decelerations: None     Contraction Frequency (min): irritability  Impression: reactive   Cervical Exam at time of presentation was 3/50-60/-1/posterior (performed by nurses).  Her cervix did not change for the total of 6 hours she was in triage   Plan: NST performed was reviewed and was found to be reactive. She was discharged home with bleeding/labor precautions.  She is scheduled for a repeat C-section on Monday 11/06/2017. She can follow up with her OB/GYN as necessary prior to then. She has already been seen in the office for her regularly scheduled appointment on yesterday.    Hildred LaserAnika Copeland Neisen, MD [facility]

## 2017-11-06 NOTE — Anesthesia Post-op Follow-up Note (Signed)
  Anesthesia Pain Follow-up Note  Patient: Kirsten Danella MaiersM Mckinstry  Day #: 1  Date of Follow-up: 11/06/2017 Time: 7:36 AM  Last Vitals:  Vitals:   11/05/17 2320 11/06/17 0410  BP: 115/80 (!) 112/51  Pulse: 68 71  Resp: 18 20  Temp: 36.5 C 36.4 C  SpO2: 100% 100%    Level of Consciousness: alert  Pain: mild   Side Effects:None  Catheter Site Exam:clean, dry, no drainage     Plan: D/C from anesthesia care at surgeon's request  Zachary GeorgeWeatherly,  Chrishaun Sasso F

## 2017-11-06 NOTE — Anesthesia Postprocedure Evaluation (Signed)
Anesthesia Post Note  Patient: Kirsten Wang  Procedure(s) Performed: CESAREAN SECTION (N/A )  Patient location during evaluation: Mother Baby Anesthesia Type: Spinal Level of consciousness: awake and awake and alert Pain management: pain level controlled Vital Signs Assessment: post-procedure vital signs reviewed and stable Respiratory status: spontaneous breathing Cardiovascular status: stable Postop Assessment: no headache, no apparent nausea or vomiting, adequate PO intake and patient able to bend at knees Anesthetic complications: no     Last Vitals:  Vitals:   11/05/17 2320 11/06/17 0410  BP: 115/80 (!) 112/51  Pulse: 68 71  Resp: 18 20  Temp: 36.5 C 36.4 C  SpO2: 100% 100%    Last Pain:  Vitals:   11/06/17 0500  TempSrc:   PainSc: 5                  Zachary GeorgeWeatherly,  Kirsten Wang

## 2017-11-06 NOTE — Lactation Note (Signed)
This note was copied from a baby's chart. Lactation Consultation Note  Patient Name: Boy Arihanna Morrie Sheldonshley Today's Date: 11/06/2017  Mom has only been giving bottles of formula since urine tox screen came back positive for MJ.  Discussed with mom that if she wanted to breast feed and/or give her breast milk later when she tested negative for MJ, we could continue to pump for stimulation.  Mom is in agreement, but has been in a lot of pain from C/S today.  Lactation name and number written on white board and encouraged to call for questions, concerns or assistance.   Maternal Data    Feeding    LATCH Score                   Interventions    Lactation Tools Discussed/Used     Consult Status      Louis MeckelWilliams, Raahil Ong Kay 11/06/2017, 5:11 PM

## 2017-11-07 LAB — SURGICAL PATHOLOGY

## 2017-11-07 MED ORDER — OXYCODONE-ACETAMINOPHEN 5-325 MG PO TABS: 1.0000 | ORAL_TABLET | ORAL | 0 refills | Status: DC | PRN

## 2017-11-07 NOTE — Discharge Summary (Signed)
Physician Obstetric Discharge Summary  Patient ID: Kirsten Wang MRN: [id number] DOB/AGE: 26/12/1990 26 y.o.   Date of Admission: 11/05/2017  Date of Discharge: 11/07/2017  Admitting Diagnosis: Labor with prior classical uterine incision at 6151w6d  Mode of Delivery: repeat cesarean section       low uterine, transverse     Discharge Diagnosis: No other diagnosis   Intrapartum Procedures:    Post partum procedures:   Complications: none                        Discharge Day SOAP Note:  Subjective:  The patient has no complaints.  She is ambulating well. She is taking PO well. Pain is well controlled with current medications. Patient is urinating without difficulty.   She is passing flatus.    Objective  Vital signs in last 24 hours: BP 132/74 (BP Location: Left Arm)   Pulse 77   Temp 98 F (36.7 C) (Oral)   Resp 18   Ht 5\' 2"  (1.575 m)   Wt 148 lb (67.1 kg)   LMP  (LMP Unknown)   SpO2 99%   BMI 27.07 kg/m   Physical Exam: Gen: NAD Abdomen:  clean, dry, no drainage, healing Fundus Fundal Tone: Firm  Lochia Amount: Small     Data Review Labs: CBC Latest Ref Rng & Units 11/03/2017 09/01/2017 05/11/2017  WBC 3.6 - 11.0 K/uL 11.3(H) 12.5(H) 12.7(H)  Hemoglobin 12.0 - 16.0 g/dL 25.313.6 66.412.5 40.314.5  Hematocrit 35.0 - 47.0 % 39.2 37.1 42.3  Platelets 150 - 440 K/uL 188 218 271   O POS  Assessment:  Active Problems:   Indication for care in labor or delivery   Doing well.  Normal progress as expected.    Plan:  Discharge to home  Modified rest as directed - may slowly resume normal activities with restrictions  as discussed.  Medications as written.  See below for additional.       Discharge Instructions: Per After Visit Summary. Activity: Advance as tolerated. Pelvic rest for 6 weeks.  Also refer to After Visit Summary.  Wound care discussed. Diet: Regular Medications: Allergies as of 11/07/2017      Reactions   Cephalosporins Hives   omnicef   Codeine Itching      Medication List    STOP taking these medications   busPIRone 10 MG tablet Commonly known as:  BUSPAR   calcium carbonate 500 MG chewable tablet Commonly known as:  TUMS - dosed in mg elemental calcium   Potassium 99 MG Tabs     TAKE these medications   acetaminophen 500 MG tablet Commonly known as:  TYLENOL Take 500 mg by mouth every 6 (six) hours as needed for mild pain or headache.   albuterol 108 (90 Base) MCG/ACT inhaler Commonly known as:  PROVENTIL HFA;VENTOLIN HFA Inhale 2 puffs into the lungs every 6 (six) hours as needed for wheezing or shortness of breath.   CITRANATAL BLOOM 90-1 MG Tabs Take 90 mg by mouth daily.   oxyCODONE-acetaminophen 5-325 MG tablet Commonly known as:  PERCOCET/ROXICET Take 1 tablet by mouth every 4 (four) hours as needed (pain scale 4-7).      Outpatient follow up:  Follow-up Information    Linzie CollinEvans, Prim Morace James, MD Follow up in 1 week(s).   Specialty:  Obstetrics and Gynecology Contact information: [address] [place] [address] [phone]          Postpartum  contraception: Will discuss at first post-partum visit.  Discharged Condition: good  Discharged to: home  Newborn Data: Disposition:home with mother  Apgars: APGAR (1 MIN): 8   APGAR (5 MINS): 9   APGAR (10 MINS):    Elonda Husky, M.D. 11/07/2017 2:14 PM

## 2017-11-07 NOTE — Progress Notes (Signed)
Patient discharged home with infant. Discharge instructions, prescriptions and follow up appointment given to and reviewed with patient. Patient verbalized understanding. Patient wheeled out with infant by auxiliary.  

## 2017-11-07 NOTE — Clinical Social Work Maternal (Signed)
CLINICAL SOCIAL WORK MATERNAL/CHILD NOTE  Patient Details  Name: Kirsten Danella MaiersM Rost MRN: [id number] Date of Birth: 11/22/1990  Date:  11/07/2017  Clinical Social Worker Initiating Note:  York SpanielMonica Eris Hannan MSW,LCSW Date/Time: Initiated:  11/07/17/      Child's Name:      Biological Parents:  Mother, Father   Need for Interpreter:  None   Reason for Referral:  Current Substance Use/Substance Use During Pregnancy    Address:  [address] [place] [address]    Phone number:  [phone] (home) [phone] (work)    Additional phone number: none  Household Members/Support Persons (HM/SP):       HM/SP Name Relationship DOB or Age  HM/SP -1        HM/SP -2        HM/SP -3        HM/SP -4        HM/SP -5        HM/SP -6        HM/SP -7        HM/SP -8          Natural Supports (not living in the home):  Friends   Professional Supports: Other (Comment)(psychiatrist)   Employment: Part-time   Type of Work:     Education:      Homebound arranged:    Surveyor, quantityinancial Resources:  Medicaid   Other Resources[facility]   Cultural/Religious Considerations Which May Impact Care:  none  Strengths:  Ability to meet basic needs , Home prepared for child    Psychotropic Medications:         Pediatrician:       [place]    [place]    [place]      Pediatrician Fax Number:    Risk Factors/Current Problems:  Substance Use    Cognitive State:  Able to Concentrate , Alert    Mood/Affect:  Anxious , Calm , Tearful    CSW Assessment: CSW spoke with patient this morning and her significant other was in the bedside chair sleeping. Patient was holding and bonding well with her newborn. CSW introduced self and explained purpose of visit. Patient states in the home is her, her significant other, her 26 year old, her 2616 month old twins and now her newborn. Patient states she has all  necessities in the home, has no concerns regarding transportation. She has limited support systems. She has some friends, her father, and her significant other. Patient became tearful at discussion of support systems and at times feeling very overwhelmed. She states she sees a psychiatrist for severe anxiety and is prescribed buspar and xanax but she didn't take the xanax during pregnancy. Patient states she has an upcoming appt at end of march. CSW provided supportive listening and counseling regarding her anxiety and her taking caring of herself as well. CSW discussed different coping mechanisms she could utilize at times when things are overwhelming. CSW also discussed postpartum depression and encouraged her to seek help should she begin showing more signs and symptoms.   CSW informed patient that a DSS CPS report would be made due to both her and her newborn testing positive for marijuana. Patient became anxious at that point but stated that she was not worried and that if they did a home visit, DSS would not find any concerns. She asked appropriate questions and CSW explained  how the process of reporting worked.   CSW Plan/Description:  Child Protective Service Report     York Spaniel, LCSW 11/07/2017, 8:53 AM

## 2017-11-09 ENCOUNTER — Telehealth: Payer: Self-pay | Admitting: Obstetrics and Gynecology

## 2017-11-09 NOTE — Telephone Encounter (Signed)
Pt is s/p c/s 4 days ago by DJE. She states her incision is bruised and it hurts really bad. She is taking percocet 1-2 every 4 h and ibuprofen 800 tid. This only takes the edge off. NO fevers. She is having muscle spasms. Chills at times. Pos eating and drinking. Mild nausea. Vomiting x 1. No diarrhea. Pain with urinating esp. In the am. Painful bm x 1. NO redness or red streaks on incision. Slight drainage - orange in color. Pt is still swollen. Advised pt to continue with current pain meds. Take them on time. Stay hydrated. Add colace bid. F/u with AC in the morning. She has an appt at 9pm. Advised if pain gets worse to go to ed or come in the office at 8am. Pt voices understanding.

## 2017-11-09 NOTE — Telephone Encounter (Signed)
The patient called and stated that she is experiencing issues with her incision, It is currently swollen, bruised, warm to the touch, leaking fluid, and she is in a lot of pain in her general pelvic area. All alternate numbers should be tried due to phone issue as well as this one. [phone]. Also the patients medication is not working No other information was disclosed, The patient needs to know if she needs to be seen or speak with a nurse as soon as possible. Please advise.

## 2017-11-09 NOTE — Telephone Encounter (Signed)
The patient would also like to know when she can come in to get a depo injection. No other information was disclosed. Please advise.

## 2017-11-10 ENCOUNTER — Ambulatory Visit (INDEPENDENT_AMBULATORY_CARE_PROVIDER_SITE_OTHER): Payer: Medicaid Other | Admitting: Obstetrics and Gynecology

## 2017-11-10 ENCOUNTER — Encounter: Payer: Self-pay | Admitting: Obstetrics and Gynecology

## 2017-11-10 VITALS — BP 106/73 | HR 87 | Wt 139.5 lb

## 2017-11-10 DIAGNOSIS — Z98891 History of uterine scar from previous surgery: Secondary | ICD-10-CM

## 2017-11-10 DIAGNOSIS — G8918 Other acute postprocedural pain: Secondary | ICD-10-CM

## 2017-11-10 DIAGNOSIS — M62838 Other muscle spasm: Secondary | ICD-10-CM

## 2017-11-10 DIAGNOSIS — K5900 Constipation, unspecified: Secondary | ICD-10-CM

## 2017-11-10 MED ORDER — IBUPROFEN 800 MG PO TABS: 800.0000 mg | ORAL_TABLET | Freq: Three times a day (TID) | ORAL | 1 refills | Status: DC | PRN

## 2017-11-10 MED ORDER — OXYCODONE-ACETAMINOPHEN 5-325 MG PO TABS: 1.0000 | ORAL_TABLET | ORAL | 0 refills | Status: DC | PRN

## 2017-11-10 MED ORDER — CYCLOBENZAPRINE HCL 10 MG PO TABS: 10.0000 mg | ORAL_TABLET | Freq: Three times a day (TID) | ORAL | 2 refills | Status: DC | PRN

## 2017-11-10 NOTE — Progress Notes (Signed)
Pt incision is swollen, bruised, and oozing fluid noticed it on the 6th of March, 19. Pt is in a lot of pain. Muscle spasms.

## 2017-11-10 NOTE — Progress Notes (Signed)
    OBSTETRICS/GYNECOLOGY POST-OPERATIVE CLINIC VISIT  Subjective:     Kirsten Wang is a 27 y.o. female who presents to the clinic 5 days status post repeat c-section for history of prior C-section (classical). Eating a regular diet without difficulty. Bowel movements are abnormal with constipation. Pain is not well controlled.  Medications being used: ibuprofen (OTC) and narcotic analgesics including oxycodone/acetaminophen (Percocet, Tylox).  She also notes clear drainage from her incision site, and bruising and swelling.   The following portions of the patient's history were reviewed and updated as appropriate: allergies, current medications, past family history, past medical history, past social history, past surgical history and problem list.  Review of Systems A comprehensive review of systems was negative except for: Musculoskeletal: positive for muscle spasms in neck, chest, and abdomen    Objective:    BP 106/73   Pulse 87   Wt 139 lb 8 oz (63.3 kg)   Breastfeeding? Yes   BMI 25.51 kg/m  General:  alert and no distress  Abdomen: soft, bowel sounds active, non-tender  Incision:   healing well, no drainage, no erythema, no hernia, no seroma, no dehiscence, incision well approximated.  Swelling noted above the incision site, and moderate amount of bruising present above and below the incision.     Assessment:    Postoperative course complicated by unmanaged pain  Constipation Muscle spasms   Plan:   1. Continue any current medications.  Refill given on Percocet as she notes she only has 3 left.  Has had to take 2 every 4 hours for relief.  Also prescribed Flexeril for muscle spasms.  Patient given prescription for Ibuprofen as she says taking several of the smaller tablets makes her uneasy as she feels she is taking too many. Also recommended OTC Lidocaine patches to put near the incision.  Just started taking Colace yesterday for her constipation. Also encouraged adequate  hydration, increasing fiber.  2. Wound care discussed.  Continue ice packs to region.  Keep incision clean and dry.  3. Operative findings again reviewed. Pathology report discussed. 4. Activity restrictions: no bending, stooping, or squatting, no lifting more than 10 pounds and pelvic rest 5. Anticipated return to work: not applicable. 6. Follow up: 5 weeks for final postpartum visit.     Hildred Laserherry, Kirsten Staebell, MD [facility]

## 2017-11-14 ENCOUNTER — Encounter: Payer: Medicaid Other | Admitting: Obstetrics and Gynecology

## 2017-12-14 ENCOUNTER — Encounter: Payer: Medicaid Other | Admitting: Obstetrics and Gynecology

## 2017-12-20 ENCOUNTER — Telehealth: Payer: Self-pay | Admitting: Obstetrics and Gynecology

## 2017-12-20 NOTE — Telephone Encounter (Signed)
The patient called and stated that she will not see Dr. Logan BoresEvans for her post partum visit, I informed the patient that a nurse will call her back to speak with her in regards to her needing to see the provider that delivered her baby. Please advise.

## 2017-12-20 NOTE — Telephone Encounter (Signed)
Pt is 6 weeks s/p c/s. She recently missed her ppv. She states her incision is slightly open (1/2 inch) draining yellowish, bloody d/c. NO odor. Pos for redness and swollen. She states the incision is painful and burning. She states he UT hurts. Vaginal bleeding x 5-7 days. Changing q 1 hour. NO uti sx. Pos for regular BM. Pos for eating and drinking. She thinks she may have had a low grade fever. Advised pt see DJE tomorrow. She refused to see DJE. [facility]/02/[facility] (ac 1st available). I advise pt to not wait until 4/30 to have incision looked at. Encouraged her to be seen tomorrow. Pt refused. She states she will go to ED.

## 2018-01-02 ENCOUNTER — Ambulatory Visit (INDEPENDENT_AMBULATORY_CARE_PROVIDER_SITE_OTHER): Payer: Medicaid Other | Admitting: Obstetrics and Gynecology

## 2018-01-02 ENCOUNTER — Encounter: Payer: Self-pay | Admitting: Obstetrics and Gynecology

## 2018-01-02 DIAGNOSIS — N939 Abnormal uterine and vaginal bleeding, unspecified: Secondary | ICD-10-CM

## 2018-01-02 DIAGNOSIS — G8918 Other acute postprocedural pain: Secondary | ICD-10-CM | POA: Diagnosis not present

## 2018-01-02 DIAGNOSIS — Z30013 Encounter for initial prescription of injectable contraceptive: Secondary | ICD-10-CM | POA: Diagnosis not present

## 2018-01-02 MED ORDER — OXYCODONE-ACETAMINOPHEN 5-325 MG PO TABS: 1.0000 | ORAL_TABLET | ORAL | 0 refills | Status: DC | PRN

## 2018-01-02 MED ORDER — MEDROXYPROGESTERONE ACETATE 150 MG/ML IM SUSP
150.0000 mg | Freq: Once | INTRAMUSCULAR | Status: AC
  Administered 2018-01-02: 150 mg via INTRAMUSCULAR

## 2018-01-02 MED ORDER — MEDROXYPROGESTERONE ACETATE 150 MG/ML IM SUSP: 150.0000 mg | INTRAMUSCULAR | 3 refills | Status: DC

## 2018-01-02 NOTE — Progress Notes (Signed)
   OBSTETRICS POSTPARTUM CLINIC PROGRESS NOTE  Subjective:     Kirsten Wang is a 27 y.o. [phone] female who presents for a postpartum visit. She is 6 weeks postpartum following a repeat C-section. I have fully reviewed the prenatal and intrapartum course. The delivery was at 37 gestational weeks.  Anesthesia: spinal. Postpartum course has been well. Baby's course has been well. Baby is feeding by bottle - Gerber Gentle. Bleeding: patient has not resumed menses, but continues to note heavier bleeding requiring tampon/pad q 1 hour. No LMP recorded.. Bowel function is normal. Bladder function is normal. Patient is sexually active. Contraception method desired is Depo-Provera injections. Postpartum depression screening: negative (EDPS score is 7).  Reports that incision came open on right side last week with bleeding.  Notes she put lots of bandages on. Pain is still reported as 6/10 with moderate cramping and pain at C-section site. Mostly on right side.  Patient notes she is using the lidocaine patches and taking Ibuprofen with minimal relief.   The following portions of the patient's history were reviewed and updated as appropriate: allergies, current medications, past family history, past medical history, past social history, past surgical history and problem list.  Review of Systems Pertinent items noted in HPI and remainder of comprehensive ROS otherwise negative.   Objective:    BP 113/80   Pulse 84   Ht  (1.549 m)   Wt 127 lb 1.6 oz (57.7 kg)   Breastfeeding? No   BMI 24.02 kg/m   General:  alert and no distress   Breasts:  inspection negative, no nipple discharge or bleeding, no masses or nodularity palpable  Lungs: clear to auscultation bilaterally  Heart:  regular rate and rhythm, S1, S2 normal, no murmur, click, rub or gallop  Abdomen: soft, non-tender; bowel sounds normal; no masses,  no organomegaly.  Well healed Pfannenstiel incision. Tenderness on right-sided incision.    Vulva:  normal  Vagina: normal vagina, no discharge, exudate, lesion, or erythema  Cervix:  no cervical motion tenderness and no lesions  Corpus: normal size, contour, position, consistency, mobility, non-tender  Adnexa:  normal adnexa and no mass, fullness, tenderness  Rectal Exam: Not performed.          Labs:  Lab Results  Component Value Date   HGB 13.6 11/03/2017     Assessment:   Routine postpartum exam.   Heavy vaginal bleeding Post-operative pain   Plan:   1. Contraception: Depo-Provera injections. Patient given initial injection today.  2. Will check Hgb for h/o anemia in light of complaints of continued heavy vaginal bleeding.   3. If bleeding and cramping continues after administration of Depo Provera over the next week, will order pelvic ultrasound to assess for retained products. .  4. Patient given 1 week refill of pain medication, advised to continue use of lidocaine patches and Ibuprofen.  If pain continues, will refer to physical therapy.   Also discussed limiting strenuous activity as much as possible.  5. Follow up in: 4-6 months for annual exam, or sooner as needed.    Hildred Laser, MD [facility]

## 2018-01-02 NOTE — Progress Notes (Signed)
Pt stated that her incision opened up about a week ago. Blood came out of the incision when it opened up. Cramps with and without bllod. Going through tampons and pads changing every 1 hour. No clots. Inside of incision burns and hurt. Feeling nausea and vomited twice due to the pain. Not breastfeeding newborn. Wants depo injections as form of birth control. EPDS score 7.  Date last pap: unknown. Last Depo-Provera: na. Side Effects if any: na. Serum HCG indicated? na. Depo-Provera 150 mg IM given by: FH. Next appointment due July 16-30, 2019.

## 2018-01-08 ENCOUNTER — Other Ambulatory Visit: Payer: Self-pay | Admitting: Obstetrics and Gynecology

## 2018-01-08 ENCOUNTER — Other Ambulatory Visit: Payer: Medicaid Other

## 2018-01-15 ENCOUNTER — Telehealth: Payer: Self-pay | Admitting: *Deleted

## 2018-01-15 NOTE — Telephone Encounter (Signed)
Kirsten Wang called and states that she is still in a lot of pain . Kirsten Wang is wondering if Dr. Valentino Saxon can prescribed her additional pills until her next U/s appt and F/U which is 01/23/18 and 01/25/18. Her pharmacy is [facility] on [address] in [place]. Kirsten Wang is also requesting a call back . Her contact # is [phone]. Please advise. Thank you

## 2018-01-15 NOTE — Telephone Encounter (Signed)
Pt called no answer LM to call the office.  

## 2018-01-16 ENCOUNTER — Telehealth: Payer: Self-pay

## 2018-01-16 NOTE — Telephone Encounter (Signed)
Pt was called and spoke with her concerning her pain and other concerns. Pt was encourage to get the ultrasound that [facility] had asked to be done to see what is going on. Pt stated that she didn't make her other appointment due that she forgot about it because she was doing things for her kids. Pt was assisted with making an appt for an ultrasound and a follow up AC. Pt asked for another week of pain medication and AC stated that she needed to come in for the ultrasound to see what is going on before giving more medication.

## 2018-01-16 NOTE — Telephone Encounter (Signed)
Pt called stating that she was in pain and wanted to know if [facility] could cal her in some pain medication. Pt was advised that [facility] really wanted her to come and get her u/s to see what was going on. Pt stated that she didn't come to her appt because she forgot about her appt due to doing things with her kids. Pt was advise to take (4)  Ibuprofen or she could take (2)  tylenol. Pt made an appt for her u/s on the 16th and to see [facility] on the 17th. Pt stated that she will try the tylenol to see if that would help.

## 2018-01-17 ENCOUNTER — Encounter: Payer: Medicaid Other | Admitting: Obstetrics and Gynecology

## 2018-01-18 ENCOUNTER — Ambulatory Visit (INDEPENDENT_AMBULATORY_CARE_PROVIDER_SITE_OTHER): Payer: Medicaid Other

## 2018-01-19 ENCOUNTER — Ambulatory Visit (INDEPENDENT_AMBULATORY_CARE_PROVIDER_SITE_OTHER): Payer: Medicaid Other | Admitting: Obstetrics and Gynecology

## 2018-01-19 ENCOUNTER — Encounter: Payer: Self-pay | Admitting: Obstetrics and Gynecology

## 2018-01-19 VITALS — BP 96/64 | HR 82 | Ht 61.0 in | Wt 130.7 lb

## 2018-01-19 DIAGNOSIS — G8928 Other chronic postprocedural pain: Secondary | ICD-10-CM

## 2018-01-19 DIAGNOSIS — N941 Unspecified dyspareunia: Secondary | ICD-10-CM

## 2018-01-19 DIAGNOSIS — N898 Other specified noninflammatory disorders of vagina: Secondary | ICD-10-CM | POA: Diagnosis not present

## 2018-01-19 DIAGNOSIS — N939 Abnormal uterine and vaginal bleeding, unspecified: Secondary | ICD-10-CM | POA: Diagnosis not present

## 2018-01-19 MED ORDER — DIAZEPAM 10 MG PO TABS: ORAL_TABLET | ORAL | 1 refills | Status: DC

## 2018-01-19 MED ORDER — GABAPENTIN 300 MG PO CAPS: 300.0000 mg | ORAL_CAPSULE | Freq: Three times a day (TID) | ORAL | 3 refills | Status: DC

## 2018-01-19 MED ORDER — KETOROLAC TROMETHAMINE 10 MG PO TABS: 10.0000 mg | ORAL_TABLET | Freq: Four times a day (QID) | ORAL | 0 refills | Status: DC | PRN

## 2018-01-19 MED ORDER — OXYCODONE-ACETAMINOPHEN 5-325 MG PO TABS: 1.0000 | ORAL_TABLET | ORAL | 0 refills | Status: DC | PRN

## 2018-01-19 NOTE — Progress Notes (Signed)
GYNECOLOGY PROGRESS NOTE  Subjective:    Patient ID: Kirsten Wang, female    DOB: Feb 24, 1991, 27 y.o.   MRN: [id number]  HPI  Patient is a 27 y.o. [phone] female who presents for complaints of continued pelvic pain s/p C-section and f/u after ultrasound for persistent bleeding and pain.  She is now 9 weeks s/p surgery and still notes pain beneath incision site. She is also c/o  dyspareunia.   Patient states that she is very upset as she feels like the doctor who performed her surgery "messed me up".  Notes that her pain is starting to affect her emotionally, as she now has a newborn and 2 toddler twins to care for, and has difficulty due to her pain which makes her feel like a bad mother. Patient pleads with MD today to "just fix me".   Of note, patient also c/o vaginal discharge that has been ongoing for the past 2-3 weeks. Discharge is not itching or burning, but just heavier than normal.   The following portions of the patient's history were reviewed and updated as appropriate: allergies, current medications, past family history, past medical history, past social history, past surgical history and problem list.  Review of Systems Pertinent items noted in HPI and remainder of comprehensive ROS otherwise negative.   Objective:   Blood pressure 96/64, pulse 82, height  (1.549 m), weight 130 lb 11.2 oz (59.3 kg), not currently breastfeeding. General appearance: alert and mild distress. Tearful.  Abdomen: normal findings: bowel sounds normal, no masses palpable, no organomegaly and soft and abnormal findings:  moderate tenderness in the lower abdomen Pelvic: external genitalia normal, rectovaginal septum normal.  Vagina with small amount of thin white discharge, no odor.  Cervix normal appearing, no lesions and no motion tenderness.  Uterus mobile, nontender, normal shape and size.  Tenderness near right adnexa and pelvic sidewall. Adnexae non-palpable, bilaterally.  Extremities:  extremities normal, atraumatic, no cyanosis or edema Neurologic: Grossly normal    Imaging:  US Transvaginal Non-OB ULTRASOUND REPORT  Location: [facility] Date of Service:   01/18/2018  Indications: AUB; Postpartum Findings:  The uterus measures 7.7 x 5.4 x 4.2 cm. Echo texture is homogeneous without evidence of focal masses. The Endometrium measures 7.5 mm.  Right Ovary measures 2.0 x 1.6 x 1.6 cm. It is normal in appearance. Left Ovary measures 3.1 x 1.7 x 1.7 cm and contains a dominant follicle  measuring 1.8 x 1.7 x 1.4 cm. It is normal appearance. Survey of the adnexa demonstrates no adnexal masses. There is no free fluid in the cul de sac.  Impression: 1. Anteverted uterus appears of normal size and contour. 2. The endometrium measures 7.5 mm. 3. Bilateral ovaries appear WNL.  Dominant follicle noted on left ovary.  Recommendations: 1.Clinical correlation with the patient's History and Physical Exam.  Kari Baars, RDMS  I have reviewed this study and agree with documented findings.   Hildred Laser, MD [facility]   Assessment:   Chronic post-op pain Dyspareunia Vaginal bleeding Vaginal discharge  Plan:    1. Chronic post-op pain - discussed that her pain and bleeding are not due to any retained products of conception, pelvic masses. No obvious other identifiable source of pain outside of recent surgery. Strongly encouraged patient to f/u with the physician that did her surgery.  Patient strongly opposed to the idea, stating that she "will never speak to him again".  Advised that this may be the best  way to address her issues as I was not present during her surgery and review of the operative note does not address any concerns. Advised that at this time if patient continues to require narcotic pain management, that I would refer her to to the pain clinic, as well as to pelvic floor physical therapy.  I also gave a trial of Gabapentin for her  pain.  2. Dyspareunia- has been since her surgery. Did not experience prior to her pregnancy. Unclear cause, although patient may have adhesions/scar tissue present, or other less likely causes include undiagnosed endometriosis.  Discussed that as she recently initiated Depo Provera for her contraception, if she did indeed have endometriosis, that it would eventually suppress it.  I also offered a trial of vaginal Valium to help with pain during intercourse.  3. Nuswab VG+ performed today in light of vaginal discharge and pelvic pain.  4. Vaginal bleeding - patient notes that the heaviness has subsided, but is still having daily dark brown bleeding. Advised that as she just initiated Depo Provera her bleeding may subside with time.    A total of 25 minutes were spent face-to-face with the patient during this encounter and over half of that time dealt with counseling and coordination of care.   Hildred Laser, MD [facility]

## 2018-01-19 NOTE — Progress Notes (Signed)
Pt is present today to receive results of ultrasound. Pt stated that intercourse is painful and it was painful while the xray tech was during her ultrasound.

## 2018-01-23 ENCOUNTER — Other Ambulatory Visit: Payer: Medicaid Other

## 2018-01-23 LAB — NUSWAB VAGINITIS PLUS (VG+)
Candida albicans, NAA: NEGATIVE
Candida glabrata, NAA: NEGATIVE
Chlamydia trachomatis, NAA: NEGATIVE
Neisseria gonorrhoeae, NAA: NEGATIVE
Trich vag by NAA: NEGATIVE

## 2018-01-24 ENCOUNTER — Telehealth: Payer: Self-pay | Admitting: *Deleted

## 2018-01-24 NOTE — Telephone Encounter (Signed)
Patient called and states she needs some clarification on a medication that Dr. Valentino Saxon prescribed for her. Patient is requesting a call back. Her contact # [phone]. Please advise. Thank you

## 2018-01-25 ENCOUNTER — Encounter: Payer: Medicaid Other | Admitting: Obstetrics and Gynecology

## 2018-01-25 NOTE — Telephone Encounter (Signed)
Pt was called and informed that the pharmacy had been called and they stated that gabentin  was filled. When I called the pt back she looked at the medication and again and stated that it was correct with .

## 2018-01-25 NOTE — Telephone Encounter (Signed)
Pt was called back. Pt stated that she was confused about her pain medication. Pt stated that she was informed that she was given 3 months supply of Percocet but she only got 40 pills. Pt stated that she was in a lot of pain and had to take them every 4 hours. Pt also stated that the pharmacy gave her the  of gabapentin instead of the  that [facility] stated in their visit. Pt was informed that the pharmacy would be call to see what was going on with her medication. Pt requested a call back.

## 2018-01-31 ENCOUNTER — Ambulatory Visit: Payer: Medicaid Other | Admitting: Nurse Practitioner

## 2018-02-06 ENCOUNTER — Ambulatory Visit: Payer: Medicaid Other | Attending: Nurse Practitioner | Admitting: Nurse Practitioner

## 2018-02-06 ENCOUNTER — Ambulatory Visit
Admission: RE | Admit: 2018-02-06 | Discharge: 2018-02-06 | Disposition: A | Discharge status: Home / Self Care | Payer: Medicaid Other | Source: Ambulatory Visit | Attending: Nurse Practitioner | Admitting: Nurse Practitioner

## 2018-02-06 ENCOUNTER — Encounter: Payer: Self-pay | Admitting: Nurse Practitioner

## 2018-02-06 ENCOUNTER — Other Ambulatory Visit: Payer: Self-pay

## 2018-02-06 VITALS — BP 131/79 | HR 90 | Temp 98.0°F | Resp 16 | Ht 61.0 in | Wt 133.0 lb

## 2018-02-06 DIAGNOSIS — M533 Sacrococcygeal disorders, not elsewhere classified: Secondary | ICD-10-CM | POA: Insufficient documentation

## 2018-02-06 DIAGNOSIS — M542 Cervicalgia: Secondary | ICD-10-CM

## 2018-02-06 DIAGNOSIS — F17210 Nicotine dependence, cigarettes, uncomplicated: Secondary | ICD-10-CM | POA: Insufficient documentation

## 2018-02-06 DIAGNOSIS — M545 Low back pain, unspecified: Secondary | ICD-10-CM

## 2018-02-06 DIAGNOSIS — G8929 Other chronic pain: Secondary | ICD-10-CM | POA: Insufficient documentation

## 2018-02-06 DIAGNOSIS — Z79899 Other long term (current) drug therapy: Secondary | ICD-10-CM | POA: Diagnosis not present

## 2018-02-06 DIAGNOSIS — Z789 Other specified health status: Secondary | ICD-10-CM | POA: Diagnosis not present

## 2018-02-06 DIAGNOSIS — M899 Disorder of bone, unspecified: Secondary | ICD-10-CM

## 2018-02-06 DIAGNOSIS — G894 Chronic pain syndrome: Secondary | ICD-10-CM | POA: Diagnosis not present

## 2018-02-06 DIAGNOSIS — Z79891 Long term (current) use of opiate analgesic: Secondary | ICD-10-CM | POA: Insufficient documentation

## 2018-02-06 DIAGNOSIS — F1190 Opioid use, unspecified, uncomplicated: Secondary | ICD-10-CM | POA: Insufficient documentation

## 2018-02-06 NOTE — Progress Notes (Signed)
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 redness, or swelling Alignment: Symmetrical Functional ROM: Unrestricted ROM      Stability: No instability detected Muscle strength & Tone: Functionally intact Sensory: Unimpaired Palpation: Complains of area being tender to palpation       Provocative Tests: Lumbar Hyperextension and rotation test: Positive bilaterally for facet joint pain.  Bilateral leg raises positive for greater than 75 degrees  Patrick's Maneuver: Positive for right-sided S-I arthralgia              Gait & Posture Assessment  Ambulation: Unassisted Gait: Relatively normal for age and body habitus Posture: WNL   Lower Extremity Exam    Side: Right lower extremity  Side: Left lower extremity  Inspection: No masses, redness, swelling, or asymmetry. No contractures  Inspection: No masses, redness, swelling, or asymmetry. No contractures  Functional ROM: Unrestricted ROM          Functional ROM: Unrestricted ROM          Muscle strength &  Tone: Functionally intact  Muscle strength & Tone: Functionally intact  Sensory: Unimpaired  Sensory: Unimpaired  Palpation: No palpable anomalies  Palpation: No palpable anomalies   Assessment  Primary Diagnosis & Pertinent Problem List: Diagnoses of Chronic bilateral low back pain without sciatica (Primary Area of Pain) (R>L), Chronic neck pain (Secondary Area of Pain)(midline), Chronic right sacroiliac joint pain , Chronic pain syndrome, Opiate use, Long term prescription benzodiazepine use, Pharmacologic therapy, Disorder of skeletal system, and Problems influencing health status were pertinent to this visit.  Visit Diagnosis: 1. Chronic bilateral low back pain without sciatica (Primary Area of Pain) (R>L)   2. Chronic neck pain (Secondary Area of Pain)(midline)   3. Chronic right sacroiliac joint pain    4. Chronic pain syndrome   5. Opiate use   6. Long term prescription benzodiazepine use   7. Pharmacologic therapy  8. Disorder of skeletal system   9. Problems influencing health status    Plan of Care  Initial treatment plan:  Please be advised that as per protocol, today's visit has been an evaluation only. We have not taken over the patient's controlled substance management.  Problem-specific plan: No problem-specific Assessment & Plan notes found for this encounter.  Ordered Lab-work, Procedure(s), Referral(s), & Consult(s): Orders Placed This Encounter  Procedures  . DG Lumbar Spine Complete W/Bend  . DG Si Joints  . DG Cervical Spine With Flex & Extend  . Compliance Drug Analysis, Ur  . Comp. Metabolic Panel (12)  . Magnesium  . Vitamin B12  . Sedimentation rate  . 25-Hydroxyvitamin D Lcms D2+D3  . C-reactive protein  . Ambulatory referral to Psychology   Pharmacotherapy: Medications ordered:  No orders of the defined types were placed in this encounter.  Medications administered during this visit: Lenoir M. Pettis had no medications administered during this  visit.   Pharmacotherapy under consideration:  Opioid Analgesics: The patient was informed that there is no guarantee that she would be a candidate for opioid analgesics. The decision will be made following CDC guidelines. This decision will be based on the results of diagnostic studies, as well as Ms. Gum's risk profile.  Membrane stabilizer: To be determined at a later time Muscle relaxant: To be determined at a later time NSAID: To be determined at a later time Other analgesic(s): To be determined at a later time   Interventional therapies under consideration: Ms. Nicotra was informed that there is no guarantee that she would be a candidate for interventional therapies. The decision will be based on the results of diagnostic studies, as well as Ms. Segers's risk profile.  Possible procedure(s): Diagnostic bilateral lumbar facet nerve block Possible bilateral facet RFA Diagnostic right sacroiliac joint injection Possible right-sided sacroiliac joint RFA Diagnostic bilateral cervical facet block Possible bilateral cervical facet RFA   Provider-requested follow-up: Return for 2nd Visit, w/ Dr. Dossie Arbour, after [facility] eval.  Future Appointments  Date Time Provider [place]  02/07/2018 10:00 AM Jerl Mina, PT [facility] None  03/02/2018  1:30 PM [facility] NURSE [facility] None    Primary Care Physician: Langley Gauss Primary Care Location: [facility] Facility Note by:  Date: 02/06/2018; Time: 1:41 PM  Pain Score Disclaimer: We use the NRS-11 scale. This is a self-reported, subjective measurement of pain severity with only modest accuracy. It is used primarily to identify changes within a particular patient. It must be understood that outpatient pain scales are significantly less accurate that those used for research, where they can be applied under ideal controlled circumstances with minimal exposure to variables. In reality, the score is likely to be a  combination of pain intensity and pain affect, where pain affect describes the degree of emotional arousal or changes in action readiness caused by the sensory experience of pain. Factors such as social and work situation, setting, emotional state, anxiety levels, expectation, and prior pain experience may influence pain perception and show large inter-individual differences that may also be affected by time variables.  Patient instructions provided during this appointment: Patient Instructions   ____________________________________________________________________________________________  Appointment Policy Summary  It is our goal and responsibility to provide the medical community with assistance in the evaluation and management of patients with chronic pain. Unfortunately our resources are limited. Because we do not have an unlimited amount of time, or available appointments, we are required to closely monitor and manage their use. The  following rules exist to maximize their use:  Patient's responsibilities: 1. Punctuality:  At what time should I arrive? You should be physically present in our office 30 minutes before your scheduled appointment. Your scheduled appointment is with your assigned healthcare provider. However, it takes 5-10 minutes to be "checked-in", and another 15 minutes for the nurses to do the admission. If you arrive to our office at the time you were given for your appointment, you will end up being at least 20-25 minutes late to your appointment with the provider. 2. Tardiness:  What happens if I arrive only a few minutes after my scheduled appointment time? You will need to reschedule your appointment. The cutoff is your appointment time. This is why it is so important that you arrive at least 30 minutes before that appointment. If you have an appointment scheduled for 10:00 AM and you arrive at 10:01, you will be required to reschedule your appointment.  3. Plan ahead:   Always assume that you will encounter traffic on your way in. Plan for it. If you are dependent on a driver, make sure they understand these rules and the need to arrive early. 4. Other appointments and responsibilities:  Avoid scheduling any other appointments before or after your pain clinic appointments.  5. Be prepared:  Write down everything that you need to discuss with your healthcare provider and give this information to the admitting nurse. Write down the medications that you will need refilled. Bring your pills and bottles (even the empty ones), to all of your appointments, except for those where a procedure is scheduled. 6. No children or pets:  Find someone to take care of them. It is not appropriate to bring them in. 7. Scheduling changes:  We request "advanced notification" of any changes or cancellations. 8. Advanced notification:  Defined as a time period of more than 24 hours prior to the originally scheduled appointment. This allows for the appointment to be offered to other patients. 9. Rescheduling:  When a visit is rescheduled, it will require the cancellation of the original appointment. For this reason they both fall within the category of "Cancellations".  10. Cancellations:  They require advanced notification. Any cancellation less than 24 hours before the  appointment will be recorded as a "No Show". 11. No Show:  Defined as an unkept appointment where the patient failed to notify or declare to the practice their intention or inability to keep the appointment.  Corrective process for repeat offenders:  1. Tardiness: Three (3) episodes of rescheduling due to late arrivals will be recorded as one (1) "No Show". 2. Cancellation or reschedule: Three (3) cancellations or rescheduling will be recorded as one (1) "No Show". 3. "No Shows": Three (3) "No Shows" within a 12 month period will result in discharge from the  practice. ____________________________________________________________________________________________  ____________________________________________________________________________________________  Pain Scale  Introduction: The pain score used by this practice is the Verbal Numerical Rating Scale (VNRS-11). This is an 11-point scale. It is for adults and children 10 years or older. There are significant differences in how the pain score is reported, used, and applied. Forget everything you learned in the past and learn this scoring system.  General Information: The scale should reflect your current level of pain. Unless you are specifically asked for the level of your worst pain, or your average pain. If you are asked for one of these two, then it should be understood that it is over the past 24 hours.  Basic Activities of  Daily Living (ADL): Personal hygiene, dressing, eating, transferring, and using restroom.  Instructions: Most patients tend to report their level of pain as a combination of two factors, their physical pain and their psychosocial pain. This last one is also known as "suffering" and it is reflection of how physical pain affects you socially and psychologically. From now on, report them separately. From this point on, when asked to report your pain level, report only your physical pain. Use the following table for reference.  Pain Clinic Pain Levels (0-5/10)  Pain Level Score  Description  No Pain 0   Mild pain 1 Nagging, annoying, but does not interfere with basic activities of daily living (ADL). Patients are able to eat, bathe, get dressed, toileting (being able to get on and off the toilet and perform personal hygiene functions), transfer (move in and out of bed or a chair without assistance), and maintain continence (able to control bladder and bowel functions). Blood pressure and heart rate are unaffected. A normal heart rate for a healthy adult ranges from 60 to 100 bpm  (beats per minute).   Mild to moderate pain 2 Noticeable and distracting. Impossible to hide from other people. More frequent flare-ups. Still possible to adapt and function close to normal. It can be very annoying and may have occasional stronger flare-ups. With discipline, patients may get used to it and adapt.   Moderate pain 3 Interferes significantly with activities of daily living (ADL). It becomes difficult to feed, bathe, get dressed, get on and off the toilet or to perform personal hygiene functions. Difficult to get in and out of bed or a chair without assistance. Very distracting. With effort, it can be ignored when deeply involved in activities.   Moderately severe pain 4 Impossible to ignore for more than a few minutes. With effort, patients may still be able to manage work or participate in some social activities. Very difficult to concentrate. Signs of autonomic nervous system discharge are evident: dilated pupils (mydriasis); mild sweating (diaphoresis); sleep interference. Heart rate becomes elevated (>115 bpm). Diastolic blood pressure (lower number) rises above 100 mmHg. Patients find relief in laying down and not moving.   Severe pain 5 Intense and extremely unpleasant. Associated with frowning face and frequent crying. Pain overwhelms the senses.  Ability to do any activity or maintain social relationships becomes significantly limited. Conversation becomes difficult. Pacing back and forth is common, as getting into a comfortable position is nearly impossible. Pain wakes you up from deep sleep. Physical signs will be obvious: pupillary dilation; increased sweating; goosebumps; brisk reflexes; cold, clammy hands and feet; nausea, vomiting or dry heaves; loss of appetite; significant sleep disturbance with inability to fall asleep or to remain asleep. When persistent, significant weight loss is observed due to the complete loss of appetite and sleep deprivation.  Blood pressure and heart  rate becomes significantly elevated. Caution: If elevated blood pressure triggers a pounding headache associated with blurred vision, then the patient should immediately seek attention at an urgent or emergency care unit, as these may be signs of an impending stroke.    Emergency Department Pain Levels (6-10/10)  Emergency Room Pain 6 Severely limiting. Requires emergency care and should not be seen or managed at an outpatient pain management facility. Communication becomes difficult and requires great effort. Assistance to reach the emergency department may be required. Facial flushing and profuse sweating along with potentially dangerous increases in heart rate and blood pressure will be evident.   Distressing pain 7  Self-care is very difficult. Assistance is required to transport, or use restroom. Assistance to reach the emergency department will be required. Tasks requiring coordination, such as bathing and getting dressed become very difficult.   Disabling pain 8 Self-care is no longer possible. At this level, pain is disabling. The individual is unable to do even the most "basic" activities such as walking, eating, bathing, dressing, transferring to a bed, or toileting. Fine motor skills are lost. It is difficult to think clearly.   Incapacitating pain 9 Pain becomes incapacitating. Thought processing is no longer possible. Difficult to remember your own name. Control of movement and coordination are lost.   The worst pain imaginable 10 At this level, most patients pass out from pain. When this level is reached, collapse of the autonomic nervous system occurs, leading to a sudden drop in blood pressure and heart rate. This in turn results in a temporary and dramatic drop in blood flow to the brain, leading to a loss of consciousness. Fainting is one of the body's self defense mechanisms. Passing out puts the brain in a calmed state and causes it to shut down for a while, in order to begin the  healing process.    Summary: 1. Refer to this scale when providing [place] with your pain level. 2. Be accurate and careful when reporting your pain level. This will help with your care. 3. Over-reporting your pain level will lead to loss of credibility. 4. Even a level of 1/10 means that there is pain and will be treated at our facility. 5. High, inaccurate reporting will be documented as "Symptom Exaggeration", leading to loss of credibility and suspicions of possible secondary gains such as obtaining more narcotics, or wanting to appear disabled, for fraudulent reasons. 6. Only pain levels of 5 or below will be seen at our facility. 7. Pain levels of 6 and above will be sent to the Emergency Department and the appointment cancelled. ____________________________________________________________________________________________

## 2018-02-06 NOTE — Progress Notes (Signed)
Safety precautions to be maintained throughout the outpatient stay will include: orient to surroundings, keep bed in low position, maintain call bell within reach at all times, provide assistance with transfer out of bed and ambulation.  

## 2018-02-06 NOTE — Patient Instructions (Signed)

## 2018-02-07 ENCOUNTER — Ambulatory Visit: Payer: Medicaid Other | Attending: Obstetrics and Gynecology | Admitting: Physical Therapy

## 2018-02-07 ENCOUNTER — Other Ambulatory Visit: Payer: Self-pay | Admitting: Nurse Practitioner

## 2018-02-07 DIAGNOSIS — M545 Low back pain: Principal | ICD-10-CM

## 2018-02-07 DIAGNOSIS — G8929 Other chronic pain: Secondary | ICD-10-CM

## 2018-02-07 NOTE — Progress Notes (Signed)
Results were reviewed and found to [id number]:normal  No acute injury or pathology identified, additional testing recommended MRI to be ordered

## 2018-02-10 LAB — COMP. METABOLIC PANEL (12)
AST: 31 IU/L (ref 0–40)
Albumin/Globulin Ratio: 2.3 — ABNORMAL HIGH (ref 1.2–2.2)
Albumin: 4.2 g/dL (ref 3.5–5.5)
Alkaline Phosphatase: 67 IU/L (ref 39–117)
BUN/Creatinine Ratio: 12 (ref 9–23)
BUN: 9 mg/dL (ref 6–20)
Bilirubin Total: 0.2 mg/dL (ref 0.0–1.2)
Calcium: 9.1 mg/dL (ref 8.7–10.2)
Chloride: 105 mmol/L (ref 96–106)
Creatinine, Ser: 0.77 mg/dL (ref 0.57–1.00)
GFR calc Af Amer: 124 mL/min/{1.73_m2} (ref >59)
GFR calc non Af Amer: 108 mL/min/{1.73_m2} (ref >59)
Globulin, Total: 1.8 g/dL (ref 1.5–4.5)
Glucose: 84 mg/dL (ref 65–99)
Potassium: 4.5 mmol/L (ref 3.5–5.2)
Sodium: 143 mmol/L (ref 134–144)
Total Protein: 6 g/dL (ref 6.0–8.5)

## 2018-02-10 LAB — C-REACTIVE PROTEIN: CRP: 6 mg/L — ABNORMAL HIGH (ref 0.0–4.9)

## 2018-02-10 LAB — COMPLIANCE DRUG ANALYSIS, UR

## 2018-02-10 LAB — SEDIMENTATION RATE: Sed Rate: 5 mm/hr (ref 0–32)

## 2018-02-10 LAB — 25-HYDROXY VITAMIN D LCMS D2+D3
25-Hydroxy, Vitamin D-2: <1 ng/mL
25-Hydroxy, Vitamin D-3: 34 ng/mL
25-Hydroxy, Vitamin D: 34 ng/mL

## 2018-02-10 LAB — VITAMIN B12: Vitamin B-12: 338 pg/mL (ref 232–1245)

## 2018-02-10 LAB — MAGNESIUM: Magnesium: 2.6 mg/dL — ABNORMAL HIGH (ref 1.6–2.3)

## 2018-02-19 ENCOUNTER — Ambulatory Visit: Payer: Medicaid Other

## 2018-03-02 ENCOUNTER — Ambulatory Visit: Payer: Medicaid Other

## 2018-03-23 ENCOUNTER — Ambulatory Visit: Payer: Medicaid Other

## 2018-04-02 ENCOUNTER — Ambulatory Visit: Payer: Medicaid Other

## 2018-04-03 ENCOUNTER — Ambulatory Visit (INDEPENDENT_AMBULATORY_CARE_PROVIDER_SITE_OTHER): Payer: Medicaid Other | Admitting: Obstetrics and Gynecology

## 2018-04-03 VITALS — BP 115/65 | HR 80 | Ht 61.0 in | Wt 132.3 lb

## 2018-04-03 DIAGNOSIS — Z3042 Encounter for surveillance of injectable contraceptive: Secondary | ICD-10-CM

## 2018-04-03 DIAGNOSIS — N930 Postcoital and contact bleeding: Secondary | ICD-10-CM

## 2018-04-03 DIAGNOSIS — N921 Excessive and frequent menstruation with irregular cycle: Secondary | ICD-10-CM

## 2018-04-03 MED ORDER — MEDROXYPROGESTERONE ACETATE 150 MG/ML IM SUSP
150.0000 mg | Freq: Once | INTRAMUSCULAR | Status: AC
  Administered 2018-04-03: 150 mg via INTRAMUSCULAR

## 2018-04-03 NOTE — Progress Notes (Signed)
Date last pap: n/a Last Depo-Provera: 01/02/2018. Side Effects if any: pt c/o of aub on depo. Bleeding on/off since 1st inj. Bleeding after IC. Advised pt to make an appt if aub and bleeding after IC continues. Pt voices understanding. Serum HCG indicated? n/a Depo-Provera 150 mg IM given by: CM Next appointment due 10/15-------10/29.   BP 115/65   Pulse 80   Ht 5\' 1"  (1.549 m)   Wt 132 lb 4.8 oz (60 kg)   LMP  (LMP Unknown)   Breastfeeding? No   BMI 25.00 kg/m

## 2018-04-03 NOTE — Progress Notes (Signed)
I have reviewed the record and concur with patient management and plan.  Charlton Boule, MD [facility]     

## 2018-07-02 NOTE — Progress Notes (Deleted)
Date last pap: 05/11/17. Last Depo-Provera: 04/03/18. Side Effects if any: ***. Serum HCG indicated? ***. Depo-Provera 150 mg IM given by: FH. Next appointment due Jan 14-28, 2020.

## 2018-07-03 ENCOUNTER — Ambulatory Visit: Payer: Medicaid Other | Admitting: Obstetrics and Gynecology

## 2018-07-04 ENCOUNTER — Encounter: Payer: Self-pay | Admitting: Obstetrics and Gynecology

## 2018-07-18 NOTE — Progress Notes (Deleted)
Last depo inj: 04/03/18 UPT: Side effects:none Next Depo- Provera injection due: 1/30-2/13/20 Annual exam due:05/11/20

## 2018-07-19 ENCOUNTER — Ambulatory Visit: Payer: Medicaid Other

## 2018-07-26 ENCOUNTER — Ambulatory Visit: Payer: Medicaid Other

## 2018-10-10 ENCOUNTER — Telehealth: Payer: Self-pay | Admitting: Obstetrics and Gynecology

## 2018-10-10 NOTE — Telephone Encounter (Signed)
The patient called to schedule an appointment to get her depo injection. The patient stated that she still has her depo injection from the pharmacy dated 07/17/2018, The patient would like to know if it is expired, if it is she wants to cancel her next apt. Please advise.

## 2018-10-11 ENCOUNTER — Telehealth: Payer: Self-pay

## 2018-10-11 ENCOUNTER — Ambulatory Visit: Payer: Medicaid Other

## 2018-10-11 NOTE — Telephone Encounter (Signed)
Pt called number busy unable to leave a message for pt.

## 2018-10-11 NOTE — Telephone Encounter (Signed)
Pt had a depo inj appt today. Due to the weather I called to see if she could come in early. Cell phone line was busy. Called home phone and spoke to someone who stated pt was in [place]. Asked this person to please have pt call office at her convenience to reschedule.

## 2018-10-12 NOTE — Telephone Encounter (Signed)
Pt called no answer. number on file is busy.

## 2018-10-15 ENCOUNTER — Ambulatory Visit: Payer: Medicaid Other

## 2018-10-15 ENCOUNTER — Ambulatory Visit: Payer: Self-pay

## 2018-10-15 ENCOUNTER — Telehealth: Payer: Self-pay | Admitting: Obstetrics and Gynecology

## 2018-10-15 NOTE — Telephone Encounter (Signed)
Pt called no answer number busy.  

## 2018-10-15 NOTE — Progress Notes (Deleted)
Date last pap: Unknown. Last Depo-Provera: 04/03/2018 Side Effects if any: NA. Serum HCG indicated? NA. Depo-Provera 150 mg IM given by: Trixie Deis CMA. Next appointment due 01/01/2019 - 01/15/2019.  UPT -

## 2018-10-15 NOTE — Telephone Encounter (Signed)
Patient called requesting a refill on depo.Thanks °

## 2018-10-17 ENCOUNTER — Ambulatory Visit (INDEPENDENT_AMBULATORY_CARE_PROVIDER_SITE_OTHER): Payer: Medicaid Other | Admitting: Obstetrics and Gynecology

## 2018-10-17 VITALS — BP 116/78 | HR 63 | Ht 61.0 in | Wt 154.0 lb

## 2018-10-17 DIAGNOSIS — Z3042 Encounter for surveillance of injectable contraceptive: Secondary | ICD-10-CM | POA: Diagnosis not present

## 2018-10-17 DIAGNOSIS — Z3202 Encounter for pregnancy test, result negative: Secondary | ICD-10-CM | POA: Diagnosis not present

## 2018-10-17 LAB — POCT URINE PREGNANCY: Preg Test, Ur: NEGATIVE

## 2018-10-17 MED ORDER — MEDROXYPROGESTERONE ACETATE 150 MG/ML IM SUSP
150.0000 mg | Freq: Once | INTRAMUSCULAR | Status: AC
  Administered 2018-10-17: 150 mg via INTRAMUSCULAR

## 2018-10-17 NOTE — Progress Notes (Signed)
Date last pap: 05/11/2017 (Neg). Last Depo-Provera: 04/03/2018. Side Effects if any: Denies side effects. UPT: Negative. Depo-Provera 150 mg IM given by: Park Meo, CMA. Next appointment due between 01/03/19-01/17/19.  Patient advised to schedule annual exam. Patient verbalized understanding.    BP 116/78   Pulse 63   Ht 5\' 1"  (1.549 m)   Wt 154 lb (69.9 kg)   LMP 10/12/2018 (Exact Date)   BMI 29.10 kg/m

## 2018-10-24 ENCOUNTER — Other Ambulatory Visit: Payer: Self-pay | Admitting: Obstetrics and Gynecology

## 2019-01-10 ENCOUNTER — Encounter: Payer: Medicaid Other | Admitting: Obstetrics and Gynecology

## 2019-01-22 ENCOUNTER — Telehealth: Payer: Self-pay | Admitting: Obstetrics and Gynecology

## 2019-01-22 ENCOUNTER — Telehealth: Payer: Self-pay

## 2019-01-22 ENCOUNTER — Other Ambulatory Visit: Payer: Self-pay

## 2019-01-22 MED ORDER — MEDROXYPROGESTERONE ACETATE 150 MG/ML IM SUSP: 150.0000 mg | INTRAMUSCULAR | 0 refills | Status: DC

## 2019-01-22 NOTE — Telephone Encounter (Signed)
Coronavirus (COVID-19) Are you at risk?  Are you at risk for the Coronavirus (COVID-19)?  To be considered HIGH RISK for Coronavirus (COVID-19), you have to meet the following criteria:  . Traveled to [place], [place], [place], [place] or [place]; or in the [place] to [place], [place], [place], or [place]; and have fever, cough, and shortness of breath within the last 2 weeks of travel OR . Been in close contact with a person diagnosed with COVID-19 within the last 2 weeks and have fever, cough, and shortness of breath . IF YOU DO NOT MEET THESE CRITERIA, YOU ARE CONSIDERED LOW RISK FOR COVID-19.  What to do if you are HIGH RISK for COVID-19?  . If you are having a medical emergency, call 911. . Seek medical care right away. Before you go to a doctor's office, urgent care or emergency department, call ahead and tell them about your recent travel, contact with someone diagnosed with COVID-19, and your symptoms. You should receive instructions from your physician's office regarding next steps of care.  . When you arrive at healthcare provider, tell the healthcare staff immediately you have returned from visiting [place], [place], [place], [place] or [place]; or traveled in the [place] to [place], [place], [place], or [place]; in the last two weeks or you have been in close contact with a person diagnosed with COVID-19 in the last 2 weeks.   . Tell the health care staff about your symptoms: fever, cough and shortness of breath. . After you have been seen by a medical provider, you will be either: o Tested for (COVID-19) and discharged home on quarantine except to seek medical care if symptoms worsen, and asked to  - Stay home and avoid contact with others until you get your results (4-5 days)  - Avoid travel on public transportation if possible (such as bus, train, or airplane) or o Sent to the Emergency Department by EMS for evaluation, COVID-19 testing, and possible  admission depending on your condition and test results.  What to do if you are LOW RISK for COVID-19?  Reduce your risk of any infection by using the same precautions used for avoiding the common cold or flu:  . Wash your hands often with soap and warm water for at least 20 seconds.  If soap and water are not readily available, use an alcohol-based hand sanitizer with at least 60% alcohol.  . If coughing or sneezing, cover your mouth and nose by coughing or sneezing into the elbow areas of your shirt or coat, into a tissue or into your sleeve (not your hands). . Avoid shaking hands with others and consider head nods or verbal greetings only. . Avoid touching your eyes, nose, or mouth with unwashed hands.  . Avoid close contact with people who are sick. . Avoid places or events with large numbers of people in one location, like concerts or sporting events. . Carefully consider travel plans you have or are making. . If you are planning any travel outside or inside the [place], visit the CDC's Travelers' Health webpage for the latest health notices. . If you have some symptoms but not all symptoms, continue to monitor at home and seek medical attention if your symptoms worsen. . If you are having a medical emergency, call 911.   ADDITIONAL HEALTHCARE OPTIONS FOR PATIENTS  [facility] / e-Visit: [email or web address]         [facility]: [phone]  [facility]: [phone]                   [facility]: [phone]   Pre-screen negative, DM.   

## 2019-01-22 NOTE — Telephone Encounter (Signed)
Patient called requesting an appointment just for depo injection. Her annual exam got pushed out to July due to covid. Thanks

## 2019-01-22 NOTE — Telephone Encounter (Signed)
Called pt to schedule her appointment for depo injection.

## 2019-01-23 ENCOUNTER — Ambulatory Visit: Payer: Self-pay

## 2019-01-24 ENCOUNTER — Ambulatory Visit (INDEPENDENT_AMBULATORY_CARE_PROVIDER_SITE_OTHER): Payer: Medicaid Other | Admitting: Obstetrics and Gynecology

## 2019-01-24 ENCOUNTER — Other Ambulatory Visit: Payer: Self-pay

## 2019-01-24 VITALS — BP 101/66 | HR 76 | Ht 61.0 in | Wt 171.4 lb

## 2019-01-24 DIAGNOSIS — Z3042 Encounter for surveillance of injectable contraceptive: Secondary | ICD-10-CM | POA: Diagnosis not present

## 2019-01-24 LAB — POCT URINE PREGNANCY: Preg Test, Ur: NEGATIVE

## 2019-01-24 MED ORDER — MEDROXYPROGESTERONE ACETATE 150 MG/ML IM SUSP
150.0000 mg | Freq: Once | INTRAMUSCULAR | Status: AC
  Administered 2019-01-24: 150 mg via INTRAMUSCULAR

## 2019-01-24 NOTE — Progress Notes (Signed)
Date last pap: na Last Depo-Provera: 10/17/18 Side Effects if any:  Serum HCG indicated? Yes- negative. Depo-Provera 150 mg IM given by: Rosine Beat, CMA Next appointment due Aug 6- Aug 20th, 2020 BP 101/66   Pulse 76   Ht 5\' 1"  (1.549 m)   Wt 171 lb 6.4 oz (77.7 kg)   BMI 32.39 kg/m     Pt was here today for depo she asked for a refill on her gabapentin- pharmacy on file

## 2019-03-12 ENCOUNTER — Telehealth: Payer: Self-pay

## 2019-03-12 NOTE — Telephone Encounter (Signed)
Pt prescreened no symptoms. Needs face mask.   Coronavirus (COVID-19) Are you at risk?  Are you at risk for the Coronavirus (COVID-19)?  To be considered HIGH RISK for Coronavirus (COVID-19), you have to meet the following criteria:  . Traveled to [place], [place], [place], [place] or [place]; or in the [place] to [place], [place], [place], or [place]; and have fever, cough, and shortness of breath within the last 2 weeks of travel OR . Been in close contact with a person diagnosed with COVID-19 within the last 2 weeks and have fever, cough, and shortness of breath . IF YOU DO NOT MEET THESE CRITERIA, YOU ARE CONSIDERED LOW RISK FOR COVID-19.  What to do if you are HIGH RISK for COVID-19?  Marland Kitchen If you are having a medical emergency, call 911. . Seek medical care right away. Before you go to a doctor's office, urgent care or emergency department, call ahead and tell them about your recent travel, contact with someone diagnosed with COVID-19, and your symptoms. You should receive instructions from your physician's office regarding next steps of care.  . When you arrive at healthcare provider, tell the healthcare staff immediately you have returned from visiting [place], [place], [place], [place] or [place]; or traveled in the [place] to [place], [place], [place], or [place]; in the last two weeks or you have been in close contact with a person diagnosed with COVID-19 in the last 2 weeks.   . Tell the health care staff about your symptoms: fever, cough and shortness of breath. . After you have been seen by a medical provider, you will be either: o Tested for (COVID-19) and discharged home on quarantine except to seek medical care if symptoms worsen, and asked to  - Stay home and avoid contact with others until you get your results (4-5 days)  - Avoid travel on public transportation if possible (such as bus, train, or airplane) or o Sent to the Emergency Department by EMS for  evaluation, COVID-19 testing, and possible admission depending on your condition and test results.  What to do if you are LOW RISK for COVID-19?  Reduce your risk of any infection by using the same precautions used for avoiding the common cold or flu:  Marland Kitchen Wash your hands often with soap and warm water for at least 20 seconds.  If soap and water are not readily available, use an alcohol-based hand sanitizer with at least 60% alcohol.  . If coughing or sneezing, cover your mouth and nose by coughing or sneezing into the elbow areas of your shirt or coat, into a tissue or into your sleeve (not your hands). . Avoid shaking hands with others and consider head nods or verbal greetings only. . Avoid touching your eyes, nose, or mouth with unwashed hands.  . Avoid close contact with people who are sick. . Avoid places or events with large numbers of people in one location, like concerts or sporting events. . Carefully consider travel plans you have or are making. . If you are planning any travel outside or inside the [place], visit the CDC's Travelers' Health webpage for the latest health notices. . If you have some symptoms but not all symptoms, continue to monitor at home and seek medical attention if your symptoms worsen. . If you are having a medical emergency, call 911.   ADDITIONAL HEALTHCARE OPTIONS FOR PATIENTS  [facility] / e-Visit: [email or web address]  [facility]: [phone]  [facility]: [phone]                   [facility]: [phone]  

## 2019-03-13 ENCOUNTER — Encounter: Payer: Medicaid Other | Admitting: Obstetrics and Gynecology

## 2019-04-24 ENCOUNTER — Other Ambulatory Visit: Payer: Self-pay | Admitting: Obstetrics and Gynecology

## 2019-04-25 ENCOUNTER — Ambulatory Visit (INDEPENDENT_AMBULATORY_CARE_PROVIDER_SITE_OTHER): Payer: Medicaid Other | Admitting: Obstetrics and Gynecology

## 2019-04-25 ENCOUNTER — Ambulatory Visit: Payer: Medicaid Other

## 2019-04-25 ENCOUNTER — Other Ambulatory Visit: Payer: Self-pay

## 2019-04-25 VITALS — BP 125/86 | HR 89 | Ht 61.0 in | Wt 165.0 lb

## 2019-04-25 DIAGNOSIS — Z3042 Encounter for surveillance of injectable contraceptive: Secondary | ICD-10-CM

## 2019-04-25 MED ORDER — MEDROXYPROGESTERONE ACETATE 150 MG/ML IM SUSP
150.0000 mg | Freq: Once | INTRAMUSCULAR | Status: AC
  Administered 2019-04-25: 150 mg via INTRAMUSCULAR

## 2019-04-25 NOTE — Progress Notes (Signed)
I have reviewed the record and concur with patient management and plan.  Patient needs an annual exam with next injection.    Rubie Maid, MD [facility]

## 2019-04-25 NOTE — Progress Notes (Signed)
Date last pap: 05/11/17 Last Depo-Provera: 01/24/19 Side Effects if any: none Serum HCG indicated? n/a Depo-Provera 150 mg IM given by: Lovell Sheehan, LPN. Next appointment due Nov 5-19, 2020.   BP 125/86   Pulse 89   Ht 5\' 1"  (1.549 m)   Wt 165 lb (74.8 kg)   BMI 31.18 kg/m

## 2019-07-15 ENCOUNTER — Ambulatory Visit: Payer: Medicaid Other

## 2019-07-18 ENCOUNTER — Other Ambulatory Visit: Payer: Self-pay | Admitting: Obstetrics and Gynecology

## 2019-07-18 ENCOUNTER — Ambulatory Visit (INDEPENDENT_AMBULATORY_CARE_PROVIDER_SITE_OTHER): Payer: Medicaid Other | Admitting: Obstetrics and Gynecology

## 2019-07-18 ENCOUNTER — Other Ambulatory Visit: Payer: Self-pay

## 2019-07-18 VITALS — BP 99/67 | HR 67 | Ht 61.0 in | Wt 163.6 lb

## 2019-07-18 DIAGNOSIS — Z3042 Encounter for surveillance of injectable contraceptive: Secondary | ICD-10-CM | POA: Diagnosis not present

## 2019-07-18 MED ORDER — MEDROXYPROGESTERONE ACETATE 150 MG/ML IM SUSP: INTRAMUSCULAR | 0 refills | Status: AC

## 2019-07-18 MED ORDER — MEDROXYPROGESTERONE ACETATE 150 MG/ML IM SUSP
150.0000 mg | Freq: Once | INTRAMUSCULAR | Status: AC
  Administered 2019-07-18: 17:00:00 150 mg via INTRAMUSCULAR

## 2019-07-18 NOTE — Progress Notes (Signed)
Last depo inj: 04/25/19 UPT: N/A Side effects: none Next Depo- Provera injection due: Raymond, LPN 4/10-3/01/31 Annual exam : 05/11/17  BP 99/67   Pulse 67   Ht 5\' 1"  (1.549 m)   Wt 163 lb 9.6 oz (74.2 kg)   LMP 07/17/2019   BMI 30.91 kg/m

## 2019-07-18 NOTE — Telephone Encounter (Signed)
Pt called stating appt TODAY at 3:00 for Depo.  Pt confirmed pharmacy [id number] - [place], [address] [place].  Please send so she can pick it up for appt today.

## 2019-07-24 ENCOUNTER — Telehealth: Payer: Self-pay

## 2019-07-24 NOTE — Telephone Encounter (Signed)
Pt called no answer LM via VM to call the office to reschedule her annual exam to the date that she will be coming in for her depo injections which is January 29,2021 at 10am.

## 2019-09-04 ENCOUNTER — Encounter: Payer: Medicaid Other | Admitting: Obstetrics and Gynecology

## 2019-10-04 ENCOUNTER — Ambulatory Visit: Payer: Medicaid Other

## 2019-10-09 ENCOUNTER — Ambulatory Visit: Payer: Medicaid Other

## 2019-10-18 ENCOUNTER — Ambulatory Visit: Payer: Medicaid Other

## 2019-10-21 ENCOUNTER — Ambulatory Visit: Payer: Medicaid Other

## 2019-10-23 ENCOUNTER — Encounter: Payer: Medicaid Other | Admitting: Obstetrics and Gynecology

## 2019-10-24 ENCOUNTER — Ambulatory Visit (INDEPENDENT_AMBULATORY_CARE_PROVIDER_SITE_OTHER): Payer: Medicaid Other | Admitting: Obstetrics and Gynecology

## 2019-10-24 ENCOUNTER — Other Ambulatory Visit: Payer: Self-pay

## 2019-10-24 ENCOUNTER — Encounter: Payer: Self-pay | Admitting: Surgical

## 2019-10-24 VITALS — BP 120/79 | HR 58 | Ht 61.0 in | Wt 163.0 lb

## 2019-10-24 DIAGNOSIS — Z3042 Encounter for surveillance of injectable contraceptive: Secondary | ICD-10-CM

## 2019-10-24 LAB — POCT URINE PREGNANCY: Preg Test, Ur: NEGATIVE

## 2019-10-24 MED ORDER — MEDROXYPROGESTERONE ACETATE 150 MG/ML IM SUSP
150.0000 mg | Freq: Once | INTRAMUSCULAR | Status: AC
  Administered 2019-10-24: 150 mg via INTRAMUSCULAR

## 2019-10-24 NOTE — Progress Notes (Signed)
Date last pap: NA Last Depo-Provera: 07/18/2019 Side Effects if any: NA Serum HCG indicated? UPT negative today  Depo-Provera 150 mg IM given by: [facility] CMA  Next appointment due 01/09/2020-01/24/2020  Today's Vitals   10/24/19 1508  BP: 120/79  Pulse: (!) 58  Weight: 163 lb (73.9 kg)  Height: 5\' 1"  (1.549 m)   Body mass index is 30.8 kg/m.

## 2020-01-10 ENCOUNTER — Encounter: Payer: Medicaid Other | Admitting: Obstetrics and Gynecology

## 2020-01-21 ENCOUNTER — Other Ambulatory Visit: Payer: Self-pay | Admitting: Obstetrics and Gynecology

## 2020-01-21 ENCOUNTER — Encounter: Payer: Medicaid Other | Admitting: Obstetrics and Gynecology

## 2020-02-05 ENCOUNTER — Other Ambulatory Visit: Payer: Self-pay | Admitting: Obstetrics and Gynecology

## 2020-02-13 NOTE — Progress Notes (Deleted)
Pt present today for annual exam and medication refill.

## 2020-02-14 ENCOUNTER — Encounter: Payer: Medicaid Other | Admitting: Obstetrics and Gynecology

## 2020-02-21 ENCOUNTER — Encounter: Payer: Self-pay | Admitting: Obstetrics and Gynecology

## 2020-04-29 ENCOUNTER — Encounter: Payer: Medicaid Other | Admitting: Obstetrics and Gynecology

## 2020-07-22 ENCOUNTER — Other Ambulatory Visit: Payer: Self-pay | Admitting: Gastroenterology

## 2020-07-22 DIAGNOSIS — R197 Diarrhea, unspecified: Secondary | ICD-10-CM

## 2020-07-22 DIAGNOSIS — R1084 Generalized abdominal pain: Secondary | ICD-10-CM

## 2020-07-22 DIAGNOSIS — R112 Nausea with vomiting, unspecified: Secondary | ICD-10-CM

## 2020-08-05 ENCOUNTER — Ambulatory Visit: Admission: RE | Admit: 2020-08-05 | Payer: Medicaid Other | Source: Ambulatory Visit

## 2020-08-14 ENCOUNTER — Telehealth: Payer: Self-pay

## 2020-08-14 NOTE — Telephone Encounter (Signed)
mychart message sent to patient

## 2020-09-10 ENCOUNTER — Ambulatory Visit: Payer: Medicaid Other

## 2020-09-17 ENCOUNTER — Ambulatory Visit: Admission: RE | Admit: 2020-09-17 | Payer: Medicaid Other | Source: Ambulatory Visit

## 2020-09-23 ENCOUNTER — Ambulatory Visit: Admission: RE | Admit: 2020-09-23 | Payer: Medicaid Other | Source: Ambulatory Visit

## 2020-11-05 ENCOUNTER — Ambulatory Visit: Payer: Medicaid Other

## 2020-11-19 ENCOUNTER — Ambulatory Visit: Admission: RE | Admit: 2020-11-19 | Payer: Medicaid Other | Source: Ambulatory Visit

## 2023-04-21 ENCOUNTER — Encounter: Payer: Self-pay | Admitting: Medical Oncology

## 2023-04-21 ENCOUNTER — Emergency Department
Admission: EM | Admit: 2023-04-21 | Discharge: 2023-04-22 | Disposition: A | Discharge status: Other Acute Inpatient Hospital | Payer: Medicaid Other | Source: Home / Self Care | Attending: Emergency Medicine | Admitting: Emergency Medicine

## 2023-04-21 ENCOUNTER — Other Ambulatory Visit: Payer: Self-pay

## 2023-04-21 DIAGNOSIS — F32A Depression, unspecified: Secondary | ICD-10-CM | POA: Insufficient documentation

## 2023-04-21 DIAGNOSIS — R45851 Suicidal ideations: Secondary | ICD-10-CM | POA: Insufficient documentation

## 2023-04-21 LAB — CBC
HCT: 44.6 % (ref 36.0–46.0)
Hemoglobin: 15.5 g/dL — ABNORMAL HIGH (ref 12.0–15.0)
MCH: 31.5 pg (ref 26.0–34.0)
MCHC: 34.8 g/dL (ref 30.0–36.0)
MCV: 90.7 fL (ref 80.0–100.0)
Platelets: 211 10*3/uL (ref 150–400)
RBC: 4.92 MIL/uL (ref 3.87–5.11)
RDW: 12.1 % (ref 11.5–15.5)
WBC: 7.6 10*3/uL (ref 4.0–10.5)
nRBC: 0 % (ref 0.0–0.2)

## 2023-04-21 LAB — COMPREHENSIVE METABOLIC PANEL
ALT: 33 U/L (ref 0–44)
AST: 33 U/L (ref 15–41)
Albumin: 4.3 g/dL (ref 3.5–5.0)
Alkaline Phosphatase: 63 U/L (ref 38–126)
Anion gap: 16 — ABNORMAL HIGH (ref 5–15)
BUN: 14 mg/dL (ref 6–20)
CO2: 22 mmol/L (ref 22–32)
Calcium: 9.4 mg/dL (ref 8.9–10.3)
Chloride: 99 mmol/L (ref 98–111)
Creatinine, Ser: 0.84 mg/dL (ref 0.44–1.00)
GFR, Estimated: >60 mL/min (ref >60)
Glucose, Bld: 87 mg/dL (ref 70–99)
Potassium: 3.3 mmol/L — ABNORMAL LOW (ref 3.5–5.1)
Sodium: 137 mmol/L (ref 135–145)
Total Bilirubin: 1.8 mg/dL — ABNORMAL HIGH (ref 0.3–1.2)
Total Protein: 7.5 g/dL (ref 6.5–8.1)

## 2023-04-21 LAB — URINE DRUG SCREEN, QUALITATIVE (ARMC ONLY)
Amphetamines, Ur Screen: POSITIVE — AB
Barbiturates, Ur Screen: NOT DETECTED
Benzodiazepine, Ur Scrn: POSITIVE — AB
Cannabinoid 50 Ng, Ur ~~LOC~~: POSITIVE — AB
Cocaine Metabolite,Ur ~~LOC~~: POSITIVE — AB
MDMA (Ecstasy)Ur Screen: NOT DETECTED
Methadone Scn, Ur: NOT DETECTED
Opiate, Ur Screen: NOT DETECTED
Phencyclidine (PCP) Ur S: NOT DETECTED
Tricyclic, Ur Screen: NOT DETECTED

## 2023-04-21 LAB — ACETAMINOPHEN LEVEL: Acetaminophen (Tylenol), Serum: <10 ug/mL — ABNORMAL LOW (ref 10–30)

## 2023-04-21 LAB — POC URINE PREG, ED: Preg Test, Ur: NEGATIVE

## 2023-04-21 LAB — SALICYLATE LEVEL: Salicylate Lvl: <7 mg/dL — ABNORMAL LOW (ref 7.0–30.0)

## 2023-04-21 LAB — ETHANOL: Alcohol, Ethyl (B): <10 mg/dL (ref <10)

## 2023-04-21 MED ORDER — BUPRENORPHINE HCL-NALOXONE HCL 8-2 MG SL SUBL
1.0000 | SUBLINGUAL_TABLET | Freq: Every day | SUBLINGUAL | Status: DC
  Administered 2023-04-21: 1 via SUBLINGUAL
  Filled 2023-04-21: qty 1

## 2023-04-21 MED ORDER — SERTRALINE HCL 50 MG PO TABS: 200.0000 mg | ORAL_TABLET | Freq: Every day | ORAL | Status: DC

## 2023-04-21 MED ORDER — ALPRAZOLAM 0.5 MG PO TABS
1.0000 mg | ORAL_TABLET | Freq: Four times a day (QID) | ORAL | Status: DC
  Administered 2023-04-21: 1 mg via ORAL
  Filled 2023-04-21: qty 2

## 2023-04-21 MED ORDER — ALBUTEROL SULFATE (2.5 MG/3ML) 0.083% IN NEBU: 2.5000 mg | INHALATION_SOLUTION | Freq: Four times a day (QID) | RESPIRATORY_TRACT | Status: DC | PRN

## 2023-04-21 MED ORDER — AMPHETAMINE-DEXTROAMPHETAMINE 15 MG PO TABS: 1.0000 | ORAL_TABLET | Freq: Two times a day (BID) | ORAL | Status: DC
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
## 2023-04-21 NOTE — ED Notes (Signed)
IVC pending consult   
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
## 2023-04-21 NOTE — ED Notes (Signed)
Pt dressed out with this RN and EDT Faith  White shirt Tie dye pants White slippers and socks Blue underwear Black bra 1 ring 4 earrings Hair tie 1 belly piercing

## 2023-04-21 NOTE — ED Notes (Signed)
Pt refused pm snack and beverage at this time.

## 2023-04-22 ENCOUNTER — Encounter: Payer: Self-pay | Admitting: Nurse Practitioner

## 2023-04-22 ENCOUNTER — Inpatient Hospital Stay
Admission: AD | Admit: 2023-04-22 | Discharge: 2023-04-25 | DRG: 885 | Disposition: A | Discharge status: Home / Self Care | Payer: Medicaid Other | Attending: Psychiatry | Admitting: Psychiatry

## 2023-04-22 ENCOUNTER — Inpatient Hospital Stay (HOSPITAL_COMMUNITY): Admit: 2023-04-22 | Payer: Self-pay

## 2023-04-22 DIAGNOSIS — Z8249 Family history of ischemic heart disease and other diseases of the circulatory system: Secondary | ICD-10-CM

## 2023-04-22 DIAGNOSIS — F17290 Nicotine dependence, other tobacco product, uncomplicated: Secondary | ICD-10-CM | POA: Diagnosis present

## 2023-04-22 DIAGNOSIS — Z833 Family history of diabetes mellitus: Secondary | ICD-10-CM

## 2023-04-22 DIAGNOSIS — Z885 Allergy status to narcotic agent status: Secondary | ICD-10-CM | POA: Diagnosis not present

## 2023-04-22 DIAGNOSIS — R45851 Suicidal ideations: Secondary | ICD-10-CM | POA: Diagnosis present

## 2023-04-22 DIAGNOSIS — F332 Major depressive disorder, recurrent severe without psychotic features: Principal | ICD-10-CM | POA: Diagnosis present

## 2023-04-22 DIAGNOSIS — Z79899 Other long term (current) drug therapy: Secondary | ICD-10-CM | POA: Diagnosis not present

## 2023-04-22 DIAGNOSIS — F1990 Other psychoactive substance use, unspecified, uncomplicated: Secondary | ICD-10-CM | POA: Diagnosis present

## 2023-04-22 DIAGNOSIS — Z818 Family history of other mental and behavioral disorders: Secondary | ICD-10-CM | POA: Diagnosis not present

## 2023-04-22 DIAGNOSIS — F4310 Post-traumatic stress disorder, unspecified: Secondary | ICD-10-CM

## 2023-04-22 DIAGNOSIS — Z888 Allergy status to other drugs, medicaments and biological substances status: Secondary | ICD-10-CM | POA: Diagnosis not present

## 2023-04-22 DIAGNOSIS — F419 Anxiety disorder, unspecified: Secondary | ICD-10-CM | POA: Diagnosis present

## 2023-04-22 LAB — ETHANOL: Alcohol, Ethyl (B): <10 mg/dL (ref <10)

## 2023-04-22 MED ORDER — DIPHENHYDRAMINE HCL 50 MG/ML IJ SOLN: 50.0000 mg | Freq: Three times a day (TID) | INTRAMUSCULAR | Status: DC | PRN

## 2023-04-22 MED ORDER — THIAMINE HCL 100 MG/ML IJ SOLN: 100.0000 mg | Freq: Every day | INTRAMUSCULAR | Status: DC

## 2023-04-22 MED ORDER — MAGNESIUM HYDROXIDE 400 MG/5ML PO SUSP: 30.0000 mL | Freq: Every day | ORAL | Status: DC | PRN

## 2023-04-22 MED ORDER — CLONAZEPAM 0.5 MG PO TABS
0.5000 mg | ORAL_TABLET | Freq: Three times a day (TID) | ORAL | Status: DC
  Administered 2023-04-22 – 2023-04-25 (×10): 0.5 mg via ORAL
  Filled 2023-04-22 (×10): qty 1

## 2023-04-22 MED ORDER — TRAZODONE HCL 50 MG PO TABS
50.0000 mg | ORAL_TABLET | Freq: Every evening | ORAL | Status: DC | PRN
  Administered 2023-04-22 – 2023-04-24 (×3): 50 mg via ORAL
  Filled 2023-04-22 (×3): qty 1

## 2023-04-22 MED ORDER — SERTRALINE HCL 20 MG/ML PO CONC: 50.0000 mg | Freq: Every day | ORAL | Status: DC

## 2023-04-22 MED ORDER — LORAZEPAM 2 MG PO TABS
0.0000 mg | ORAL_TABLET | ORAL | Status: AC
  Administered 2023-04-22: 2 mg via ORAL
  Filled 2023-04-22 (×2): qty 1

## 2023-04-22 MED ORDER — LORAZEPAM 2 MG PO TABS: 0.0000 mg | ORAL_TABLET | Freq: Three times a day (TID) | ORAL | Status: DC

## 2023-04-22 MED ORDER — BUSPIRONE HCL 5 MG PO TABS
5.0000 mg | ORAL_TABLET | Freq: Two times a day (BID) | ORAL | Status: DC
  Administered 2023-04-22 – 2023-04-25 (×6): 5 mg via ORAL
  Filled 2023-04-22 (×6): qty 1

## 2023-04-22 MED ORDER — FOLIC ACID 1 MG PO TABS
1.0000 mg | ORAL_TABLET | Freq: Every day | ORAL | Status: DC
  Administered 2023-04-22 – 2023-04-25 (×4): 1 mg via ORAL
  Filled 2023-04-22 (×4): qty 1

## 2023-04-22 MED ORDER — SERTRALINE HCL 25 MG PO TABS
50.0000 mg | ORAL_TABLET | Freq: Every day | ORAL | Status: DC
  Administered 2023-04-22 – 2023-04-25 (×4): 50 mg via ORAL
  Filled 2023-04-22 (×4): qty 2

## 2023-04-22 MED ORDER — ACETAMINOPHEN 325 MG PO TABS: 650.0000 mg | ORAL_TABLET | Freq: Four times a day (QID) | ORAL | Status: DC | PRN

## 2023-04-22 MED ORDER — HYDROXYZINE HCL 25 MG PO TABS
25.0000 mg | ORAL_TABLET | Freq: Three times a day (TID) | ORAL | Status: DC | PRN
  Administered 2023-04-24: 25 mg via ORAL
  Filled 2023-04-22: qty 1

## 2023-04-22 MED ORDER — BUPRENORPHINE HCL-NALOXONE HCL 8-2 MG SL SUBL
1.0000 | SUBLINGUAL_TABLET | Freq: Two times a day (BID) | SUBLINGUAL | Status: DC
  Administered 2023-04-22 – 2023-04-25 (×7): 1 via SUBLINGUAL
  Filled 2023-04-22 (×7): qty 1

## 2023-04-22 MED ORDER — ADULT MULTIVITAMIN W/MINERALS CH
1.0000 | ORAL_TABLET | Freq: Every day | ORAL | Status: DC
  Administered 2023-04-22 – 2023-04-25 (×4): 1 via ORAL
  Filled 2023-04-22 (×4): qty 1

## 2023-04-22 MED ORDER — THIAMINE MONONITRATE 100 MG PO TABS
100.0000 mg | ORAL_TABLET | Freq: Every day | ORAL | Status: DC
  Administered 2023-04-22 – 2023-04-25 (×4): 100 mg via ORAL
  Filled 2023-04-22 (×3): qty 1

## 2023-04-22 MED ORDER — HALOPERIDOL LACTATE 5 MG/ML IJ SOLN: 5.0000 mg | Freq: Three times a day (TID) | INTRAMUSCULAR | Status: DC | PRN

## 2023-04-22 MED ORDER — LORAZEPAM 2 MG/ML IJ SOLN: 2.0000 mg | Freq: Three times a day (TID) | INTRAMUSCULAR | Status: DC | PRN

## 2023-04-22 MED ORDER — LORAZEPAM 2 MG PO TABS: 2.0000 mg | ORAL_TABLET | Freq: Three times a day (TID) | ORAL | Status: DC | PRN

## 2023-04-22 MED ORDER — DIPHENHYDRAMINE HCL 25 MG PO CAPS: 50.0000 mg | ORAL_CAPSULE | Freq: Three times a day (TID) | ORAL | Status: DC | PRN

## 2023-04-22 MED ORDER — HALOPERIDOL 5 MG PO TABS: 5.0000 mg | ORAL_TABLET | Freq: Three times a day (TID) | ORAL | Status: DC | PRN

## 2023-04-22 MED ORDER — ALUM & MAG HYDROXIDE-SIMETH 200-200-20 MG/5ML PO SUSP
30.0000 mL | ORAL | Status: DC | PRN
  Administered 2023-04-24: 30 mL via ORAL
  Filled 2023-04-22: qty 30

## 2023-04-22 NOTE — Progress Notes (Signed)
   04/22/23 1059  CIWA-Ar  BP 112/77  Pulse Rate (!) 57  Nausea and Vomiting 0  Tactile Disturbances 0  Tremor 0  Auditory Disturbances 0  Paroxysmal Sweats 0  Visual Disturbances 0  Anxiety 1  Headache, Fullness in Head 0  Agitation 0  Orientation and Clouding of Sensorium 0  CIWA-Ar Total 1

## 2023-04-22 NOTE — Tx Team (Signed)
Initial Treatment Plan 04/22/2023 6:08 AM Kirsten Wang ZOX:[id number]    PATIENT STRESSORS: Marital or family conflict   Substance abuse     PATIENT STRENGTHS: Ability for insight  Motivation for treatment/growth    PATIENT IDENTIFIED PROBLEMS: Anxiety   Depression                    DISCHARGE CRITERIA:  Improved stabilization in mood, thinking, and/or behavior Verbal commitment to aftercare and medication compliance  PRELIMINARY DISCHARGE PLAN: Outpatient therapy Return to previous living arrangement  PATIENT/FAMILY INVOLVEMENT: This treatment plan has been presented to and reviewed with the patient, Kirsten Wang.  The patient has been given the opportunity to ask questions and make suggestions.  Elmyra Ricks, RN 04/22/2023, 6:08 AM
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
## 2023-04-22 NOTE — ED Notes (Signed)
IVC/Pending Disposition until consult is done.
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
## 2023-04-22 NOTE — Plan of Care (Signed)
Patient new to the unit tonight, hasn't had time to progress  Problem: Education: Goal: Knowledge of General Education information will improve Description: Including pain rating scale, medication(s)/side effects and non-pharmacologic comfort measures Outcome: Not Progressing   Problem: Health Behavior/Discharge Planning: Goal: Ability to manage health-related needs will improve Outcome: Not Progressing   Problem: Clinical Measurements: Goal: Ability to maintain clinical measurements within normal limits will improve Outcome: Not Progressing Goal: Will remain free from infection Outcome: Not Progressing Goal: Diagnostic test results will improve Outcome: Not Progressing Goal: Respiratory complications will improve Outcome: Not Progressing Goal: Cardiovascular complication will be avoided Outcome: Not Progressing   Problem: Activity: Goal: Risk for activity intolerance will decrease Outcome: Not Progressing   Problem: Nutrition: Goal: Adequate nutrition will be maintained Outcome: Not Progressing   Problem: Coping: Goal: Level of anxiety will decrease Outcome: Not Progressing   Problem: Elimination: Goal: Will not experience complications related to bowel motility Outcome: Not Progressing Goal: Will not experience complications related to urinary retention Outcome: Not Progressing   Problem: Pain Managment: Goal: General experience of comfort will improve Outcome: Not Progressing   Problem: Safety: Goal: Ability to remain free from injury will improve Outcome: Not Progressing   Problem: Skin Integrity: Goal: Risk for impaired skin integrity will decrease Outcome: Not Progressing   Problem: Education: Goal: Knowledge of [place] [facility] Education information/materials will improve Outcome: Not Progressing Goal: Emotional status will improve Outcome: Not Progressing Goal: Mental status will improve Outcome: Not Progressing Goal: Verbalization of  understanding the information provided will improve Outcome: Not Progressing   Problem: Activity: Goal: Interest or engagement in activities will improve Outcome: Not Progressing Goal: Sleeping patterns will improve Outcome: Not Progressing   Problem: Coping: Goal: Ability to verbalize frustrations and anger appropriately will improve Outcome: Not Progressing Goal: Ability to demonstrate self-control will improve Outcome: Not Progressing   Problem: Health Behavior/Discharge Planning: Goal: Identification of resources available to assist in meeting health care needs will improve Outcome: Not Progressing Goal: Compliance with treatment plan for underlying cause of condition will improve Outcome: Not Progressing   Problem: Physical Regulation: Goal: Ability to maintain clinical measurements within normal limits will improve Outcome: Not Progressing   Problem: Safety: Goal: Periods of time without injury will increase Outcome: Not Progressing

## 2023-04-22 NOTE — ED Notes (Signed)
Report given to [facility]
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
## 2023-04-22 NOTE — Plan of Care (Signed)
New admission at 6 am.  Problem: Education: Goal: Knowledge of General Education information will improve Description: Including pain rating scale, medication(s)/side effects and non-pharmacologic comfort measures Outcome: Not Progressing   Problem: Health Behavior/Discharge Planning: Goal: Ability to manage health-related needs will improve Outcome: Not Progressing   Problem: Clinical Measurements: Goal: Ability to maintain clinical measurements within normal limits will improve Outcome: Not Progressing Goal: Will remain free from infection Outcome: Not Progressing Goal: Diagnostic test results will improve Outcome: Not Progressing Goal: Respiratory complications will improve Outcome: Not Progressing Goal: Cardiovascular complication will be avoided Outcome: Not Progressing   Problem: Activity: Goal: Risk for activity intolerance will decrease Outcome: Not Progressing   Problem: Nutrition: Goal: Adequate nutrition will be maintained Outcome: Not Progressing   Problem: Coping: Goal: Level of anxiety will decrease Outcome: Not Progressing   Problem: Elimination: Goal: Will not experience complications related to bowel motility Outcome: Not Progressing Goal: Will not experience complications related to urinary retention Outcome: Not Progressing   Problem: Pain Managment: Goal: General experience of comfort will improve Outcome: Not Progressing   Problem: Safety: Goal: Ability to remain free from injury will improve Outcome: Not Progressing   Problem: Skin Integrity: Goal: Risk for impaired skin integrity will decrease Outcome: Not Progressing   Problem: Education: Goal: Knowledge of [place] [facility] Education information/materials will improve Outcome: Not Progressing Goal: Emotional status will improve Outcome: Not Progressing Goal: Mental status will improve Outcome: Not Progressing Goal: Verbalization of understanding the information provided will  improve Outcome: Not Progressing   Problem: Activity: Goal: Interest or engagement in activities will improve Outcome: Not Progressing Goal: Sleeping patterns will improve Outcome: Not Progressing   Problem: Coping: Goal: Ability to verbalize frustrations and anger appropriately will improve Outcome: Not Progressing Goal: Ability to demonstrate self-control will improve Outcome: Not Progressing   Problem: Health Behavior/Discharge Planning: Goal: Identification of resources available to assist in meeting health care needs will improve Outcome: Not Progressing Goal: Compliance with treatment plan for underlying cause of condition will improve Outcome: Not Progressing   Problem: Physical Regulation: Goal: Ability to maintain clinical measurements within normal limits will improve Outcome: Not Progressing   Problem: Safety: Goal: Periods of time without injury will increase Outcome: Not Progressing
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
## 2023-04-22 NOTE — Group Note (Signed)
[facility] LCSW Group Therapy Note   Group Date: 04/22/2023 Start Time: 1310 End Time: 1410   Type of Therapy/Topic:  Group Therapy:  Balance in Life  Participation Level:  Active   Description of Group:    This group will address the concept of balance and how it feels and looks when one is unbalanced. Patients will be encouraged to process areas in their lives that are out of balance, and identify reasons for remaining unbalanced. Facilitators will guide patients utilizing problem- solving interventions to address and correct the stressor making their life unbalanced. Understanding and applying boundaries will be explored and addressed for obtaining  and maintaining a balanced life. Patients will be encouraged to explore ways to assertively make their unbalanced needs known to significant others in their lives, using other group members and facilitator for support and feedback.  Therapeutic Goals: Patient will identify two or more emotions or situations they have that consume much of in their lives. Patient will identify signs/triggers that life has become out of balance:  Patient will identify two ways to set boundaries in order to achieve balance in their lives:  Patient will demonstrate ability to communicate their needs through discussion and/or role plays  Summary of Patient Progress:The patient attended group. The patient arrived late but was open about her experiences. The patient stated that she balance life by staying organized. The patient stated that she practices self care by cleaning her house and cleaning self up.     Marshell Levan, LCSW

## 2023-04-22 NOTE — Progress Notes (Signed)
Patient given handouts on Bipolar disorder, per MD request.

## 2023-04-23 MED ORDER — DIVALPROEX SODIUM 250 MG PO DR TAB
250.0000 mg | DELAYED_RELEASE_TABLET | Freq: Two times a day (BID) | ORAL | Status: DC
  Administered 2023-04-23 – 2023-04-25 (×4): 250 mg via ORAL
  Filled 2023-04-23 (×4): qty 1

## 2023-04-23 NOTE — [facility] Suicide Risk Assessment (Signed)
[facility] INPATIENT:  Family/Significant Other Suicide Prevention Education  Suicide Prevention Education:  Patient Refusal for Family/Significant Other Suicide Prevention Education: The patient Kirsten Wang has refused to provide written consent for family/significant other to be provided Family/Significant Other Suicide Prevention Education during admission and/or prior to discharge.  Physician notified.  Marshell Levan 04/23/2023, 11:00 AM

## 2023-04-23 NOTE — Plan of Care (Signed)
  Problem: Health Behavior/Discharge Planning: Goal: Compliance with treatment plan for underlying cause of condition will improve Outcome: Progressing  Patient is complaint with medication regimen and has insight into treatment plan.

## 2023-04-23 NOTE — Progress Notes (Signed)
   04/23/23 1000  Psych Admission Type (Psych Patients Only)  Admission Status Involuntary  Psychosocial Assessment  Patient Complaints Anxiety;Depression  Eye Contact Fair  Facial Expression Sad;Worried  Affect Anxious  Speech Logical/coherent  Interaction Assertive  Motor Activity Slow  Appearance/Hygiene Unremarkable  Behavior Characteristics Cooperative  Mood Preoccupied  Aggressive Behavior  Effect No apparent injury  Thought Process  Coherency WDL  Content Blaming others  Delusions None reported or observed  Perception WDL  Hallucination None reported or observed  Judgment Impaired  Confusion None  Danger to Self  Current suicidal ideation? Denies  Danger to Others  Danger to Others None reported or observed   Patient alert and oriented x4, calm and cooperative on unit, participating in activities, rates depresion 5/10, hopelessness 2/10, anxiety 8/10. Patients goal per self inventory sheet, "stabilizing my mood and medications, so far all has been well with the new changes." Verbal support and encouragement given.

## 2023-04-23 NOTE — Group Note (Signed)
Date:  04/23/2023 Time:  9:36 PM  Group Topic/Focus:  Wrap-Up Group:   The focus of this group is to help patients review their daily goal of treatment and discuss progress on daily workbooks.    Participation Level:  Active  Participation Quality:  Appropriate  Affect:  Appropriate  Cognitive:  Appropriate  Insight: Appropriate  Engagement in Group:  Supportive  Modes of Intervention:  Support  Additional Comments:     Belva Crome 04/23/2023, 9:36 PM

## 2023-04-23 NOTE — Progress Notes (Addendum)
Patient alert and oriented x 4, affect is flat but brightens upon approach, she denies SI/HI/AVH, she was noted interacting appropriately with peers in the milieu, she appears less anxious, thoughts are organized, speech is non tangential. Patient is on CIWA she was noted less anxious and was medicated per CIWA protocol. 15 minutes safety checks maintained will continue to maintain.

## 2023-04-23 NOTE — Group Note (Signed)
Date:  04/23/2023 Time:  7:08 PM  Group Topic/Focus:  Structured Activity Group    Participation Level:  Active  Participation Quality:  Appropriate  Affect:  Appropriate  Cognitive:  Alert and Appropriate  Insight: Appropriate  Engagement in Group:  Engaged  Modes of Intervention:  Activity  Additional Comments:    Kaleo Condrey A Kaye Luoma 04/23/2023, 7:08 PM

## 2023-04-23 NOTE — Group Note (Unsigned)
Date:  04/23/2023 Time:  9:31 PM  Group Topic/Focus:  Wrap-Up Group:   The focus of this group is to help patients review their daily goal of treatment and discuss progress on daily workbooks.     Participation Level:  {BHH PARTICIPATION WUJWJ:19147}  Participation Quality:  {BHH PARTICIPATION QUALITY:22265}  Affect:  {BHH AFFECT:22266}  Cognitive:  {BHH COGNITIVE:22267}  Insight: {BHH Insight2:20797}  Engagement in Group:  {BHH ENGAGEMENT IN WGNFA:21308}  Modes of Intervention:  {BHH MODES OF INTERVENTION:22269}  Additional Comments:  ***  Belva Crome 04/23/2023, 9:31 PM

## 2023-04-23 NOTE — Plan of Care (Signed)
?  Problem: Activity: ?Goal: Interest or engagement in activities will improve ?Outcome: Progressing ?Goal: Sleeping patterns will improve ?Outcome: Progressing ?  ?Problem: Coping: ?Goal: Ability to verbalize frustrations and anger appropriately will improve ?Outcome: Progressing ?Goal: Ability to demonstrate self-control will improve ?Outcome: Progressing ?  ?Problem: Safety: ?Goal: Periods of time without injury will increase ?Outcome: Progressing ?  ?

## 2023-04-23 NOTE — Group Note (Signed)
Date:  04/23/2023 Time:  9:50 PM  Group Topic/Focus:  Wrap-Up Group:   The focus of this group is to help patients review their daily goal of treatment and discuss progress on daily workbooks.    Participation Level:  Active  Participation Quality:  Appropriate  Affect:  Appropriate  Cognitive:  Appropriate  Insight: Appropriate  Engagement in Group:  Supportive  Modes of Intervention:  Support  Additional Comments:     Belva Crome 04/23/2023, 9:50 PM

## 2023-04-23 NOTE — Progress Notes (Signed)
[facility] MD Progress Note  04/23/2023 11:18 AM Kirsten Wang  MRN:  [id number]  Subjective: Chart reviewed, case discussed with staff, patient seen during a.m. rounds.  Patient reports she feels anxious today.  She reports she slept better last night.  She is anxious about going home and getting back to her children.  Patient was provided with support and reassurance.  Patient was encouraged to talk to social worker about rehab options.  Patient shared that she has read on bipolar disorder and believes she has some mood symptoms including elated mood with racing thoughts, pressured speech, poor impulse control, and irritability.  Patient reports that the symptoms might last for couple of hours to 1 to 2 days.  We discussed criteria for hypomania and mania.  Patient probably has mood symptoms secondary to PTSD.  We discussed different mood stabilizer with the side effects.  Patient agrees to try Depakote for mood stabilization.  Side effect of Depakote including effects on the fetus discussed with the patient.  Patient verbalized understanding of these side effects.  Patient was encouraged to continue attending group and work on coping strategies and a safe discharge plan.  Today patient denies suicidal thoughts, no intention or plan.  She denies homicidal ideations or psychotic symptoms  Principal Problem: MDD (major depressive disorder), recurrent episode, severe (HCC) Diagnosis: Principal Problem:   MDD (major depressive disorder), recurrent episode, severe (HCC) Active Problems:   Suicidal ideation   Polysubstance use disorder   PTSD (post-traumatic stress disorder)   Past Psychiatric History:  Patient reports past history of depression, anxiety, opiate use disorder, in full remission maintained on Suboxone.   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     Family History:  Family History  Problem Relation Age of Onset   Diabetes Mother    Depression Maternal Grandmother    Hypertension Maternal Grandmother    Breast cancer Neg Hx    Ovarian cancer Neg Hx    Colon cancer Neg Hx     Social History:  Social History   Substance and Sexual Activity  Alcohol Use Not Currently     Social History   Substance and Sexual Activity  Drug Use Not Currently   Types: Marijuana    Social History   Socioeconomic History   Marital status: Single    Spouse name: Not on file   Number of children: Not on file   Years of education: Not on file   Highest education level: Not on file  Occupational History   Not on file  Tobacco Use   Smoking status: Former    Current packs/day: 0.00    Average packs/day: 1 pack/day for 11.7 years (11.7 ttl pk-yrs)    Types: Cigarettes    Start date: 09/05/2006    Quit date: 05/06/2018    Years since quitting: 4.9   Smokeless tobacco: Never  Vaping  Use   Vaping status: Every Day   Start date: 05/06/2018  Substance and Sexual Activity   Alcohol use: Not Currently   Drug use: Not Currently    Types: Marijuana   Sexual activity: Yes    Birth control/protection: Injection  Other Topics Concern   Not on file  Social History Narrative   Not on file   Social Determinants of Health   Financial Resource Strain: Not on file  Food Insecurity: No Food Insecurity (04/22/2023)   Hunger Vital Sign    Worried About Running Out of Food in the Last Year: Never true    Ran Out of Food in the Last Year:  Never true  Transportation Needs: No Transportation Needs (04/22/2023)   PRAPARE - Administrator, Civil Service (Medical): No    Lack of Transportation (Non-Medical): No  Physical Activity: Not on file  Stress: Not on file  Social Connections: Not on file   Additional Social History:                         Sleep: Fair  Appetite:  Fair  Current Medications: Current Facility-Administered Medications  Medication Dose Route Frequency Provider Last Rate Last Admin   acetaminophen (TYLENOL) tablet 650 mg  650 mg Oral Q6H PRN Bobbitt, Shalon E, NP       alum & mag hydroxide-simeth (MAALOX/MYLANTA) 200-200-20 MG/5ML suspension 30 mL  30 mL Oral Q4H PRN Bobbitt, Shalon E, NP       buprenorphine-naloxone (SUBOXONE) 8-2 mg per SL tablet 1 tablet  1 tablet Sublingual BID Lewanda Rife, MD   1 tablet at 04/23/23 0858   busPIRone (BUSPAR) tablet 5 mg  5 mg Oral BID Lewanda Rife, MD   5 mg at 04/23/23 0858   clonazePAM (KLONOPIN) tablet 0.5 mg  0.5 mg Oral TID Lewanda Rife, MD   0.5 mg at 04/23/23 5176   diphenhydrAMINE (BENADRYL) capsule 50 mg  50 mg Oral TID PRN Bobbitt, Shalon E, NP       Or   diphenhydrAMINE (BENADRYL) injection 50 mg  50 mg Intramuscular TID PRN Bobbitt, Shalon E, NP       divalproex (DEPAKOTE) DR tablet 250 mg  250 mg Oral BID Lewanda Rife, MD       folic acid (FOLVITE) tablet 1 mg  1 mg Oral Daily Lewanda Rife, MD   1 mg at 04/23/23 0858   haloperidol (HALDOL) tablet 5 mg  5 mg Oral TID PRN Bobbitt, Shalon E, NP       Or   haloperidol lactate (HALDOL) injection 5 mg  5 mg Intramuscular TID PRN Bobbitt, Shalon E, NP       hydrOXYzine (ATARAX) tablet 25 mg  25 mg Oral TID PRN Bobbitt, Shalon E, NP       LORazepam (ATIVAN) tablet 0-4 mg  0-4 mg Oral Q4H Angelica Wix, MD   2 mg at 04/22/23 2147   Followed by   Melene Muller ON 04/24/2023] LORazepam (ATIVAN) tablet 0-4 mg  0-4 mg Oral Q8H Neshawn Aird, MD       magnesium  hydroxide (MILK OF MAGNESIA) suspension 30 mL  30 mL Oral Daily PRN Bobbitt, Shalon E, NP       multivitamin with minerals tablet 1 tablet  1 tablet Oral Daily Lewanda Rife, MD   1 tablet at 04/23/23 0858   sertraline (ZOLOFT) tablet 50 mg  50 mg Oral Daily Lewanda Rife, MD   50  mg at 04/23/23 0858   thiamine (VITAMIN B1) tablet 100 mg  100 mg Oral Daily Lewanda Rife, MD   100 mg at 04/23/23 4098   Or   thiamine (VITAMIN B1) injection 100 mg  100 mg Intravenous Daily Lewanda Rife, MD       traZODone (DESYREL) tablet 50 mg  50 mg Oral QHS PRN Bobbitt, Shalon E, NP   50 mg at 04/22/23 2146    Lab Results:  Results for orders placed or performed during the hospital encounter of 04/22/23 (from the past 48 hour(s))  Ethanol     Status: None   Collection Time: 04/22/23  9:21 AM  Result Value Ref Range   Alcohol, Ethyl (B) <10 <10 mg/dL    Comment: (NOTE) Lowest detectable limit for serum alcohol is 10 mg/dL.  For medical purposes only. Performed at [facility], [address] [place], [place] [address]     Blood Alcohol level:  Lab Results  Component Value Date   [facility] <10 04/22/2023   ETH <10 04/21/2023    Musculoskeletal: Strength & Muscle Tone: within normal limits Gait & Station: normal Patient leans: N/A   Psychiatric Specialty Exam:   Presentation  General Appearance:  Appropriate for Environment Patient has colored her hair purple   Eye Contact: Good   Speech: Clear and Coherent   Speech Volume: Normal   Mood and Affect  Mood: Anxious   Affect: Congruent    Thought Process  Thought Processes: Coherent; Goal Directed   Descriptions of Associations:Intact   Orientation:Full (Time, Place and Person)   Thought Content:Abstract Reasoning; Perseveration; Rumination   History of Schizophrenia/Schizoaffective disorder:No   Duration of Psychotic Symptoms:N/A Hallucinations:Hallucinations: None   Ideas of Reference:None    Suicidal Thoughts:Denies    Homicidal Thoughts: Denies     Sensorium  Memory: Immediate Good; Recent Good; Remote Good   Judgment: Improving   Insight: Improving   Executive Functions  Concentration: Fair   Attention Span: Fair   Recall: [employer] of Knowledge: Fair   Language: Fair     Psychomotor Activity  Psychomotor Activity: Psychomotor Activity: Decreased     Assets  Assets: Communication Skills; Desire for Improvement; Resilience; Physical Health; Housing     Sleep  Sleep: Sleep: Poor       Physical Exam: Physical Exam Constitutional:      Appearance: Normal appearance.  HENT:     Head: Normocephalic and atraumatic.     Nose: Nose normal.  Eyes:     Pupils: Pupils are equal, round, and reactive to light.  Cardiovascular:     Rate and Rhythm: Normal rate.  Pulmonary:     Effort: Pulmonary effort is normal.  Skin:    General: Skin is warm.     Findings: Bruising present.  Neurological:     General: No focal deficit present.     Mental Status: She is alert and oriented to person, place, and time.      Review of Systems  HENT: Negative.    Eyes: Negative.   Respiratory: Negative.    Cardiovascular: Negative.   Gastrointestinal: Negative.   Musculoskeletal:  Positive for joint pain.  Neurological: Negative.      Blood pressure 105/74, pulse 77, temperature 97.6 F (36.4 C), resp. rate 18, height 5\' 1"  (1.549 m), weight 62.6 kg, last menstrual period 04/14/2023, SpO2 100%. Body mass index is 26.07 kg/m.   Treatment Plan Summary: Daily contact with patient to assess and evaluate symptoms  and progress in treatment and Medication management   MDD (major depressive disorder), recurrent episode, severe (HCC) Active Problems:   Suicidal ideation   Polysubstance use disorder   PTSD (post-traumatic stress disorder) with mood symptoms   Patient is admitted to locked unit under safety precautions Continue on Suboxone 8-2 mg per  sublingual tablet twice daily Discontinued Xanax and started patient on Klonopin 0.5 mg by mouth 3 times daily to avoid withdrawals Continue on CIWA protocol Continue on Zoloft 50 mg by mouth daily Continue on BuSpar 5 mg by mouth twice daily to help with anxiety Patient was encouraged to attend group and work on coping strategies Will start on Depakote 250 mg by mouth twice daily for mood stabilization Social worker consulted to get collateral and help with a safe discharge plan  Lewanda Rife, MD

## 2023-04-24 DIAGNOSIS — F332 Major depressive disorder, recurrent severe without psychotic features: Secondary | ICD-10-CM | POA: Diagnosis not present

## 2023-04-24 NOTE — Progress Notes (Signed)
Nursing Shift Note:  1900-0700  Attended Evening Group: Yes Medication Compliant:  Yes Behavior: cooperative; calm Sleep Quality: good Significant Changes: none   04/24/23 2200  Psych Admission Type (Psych Patients Only)  Admission Status Involuntary  Psychosocial Assessment  Patient Complaints Anxiety  Eye Contact Fair  Facial Expression Sad  Affect Anxious  Speech Logical/coherent  Interaction Assertive  Motor Activity Slow  Appearance/Hygiene In scrubs  Behavior Characteristics Cooperative  Mood Pleasant  Aggressive Behavior  Effect No apparent injury  Thought Process  Coherency WDL  Content Blaming others  Delusions None reported or observed  Perception WDL  Hallucination None reported or observed  Judgment Impaired  Confusion None  Danger to Self  Current suicidal ideation? Denies  Danger to Others  Danger to Others None reported or observed

## 2023-04-24 NOTE — BH IP Treatment Plan (Signed)
Interdisciplinary Treatment and Diagnostic Plan Update  04/24/2023 Time of Session: 9:23AM Kirsten Wang MRN: [id number]  Principal Diagnosis: MDD (major depressive disorder), recurrent episode, severe (HCC)  Secondary Diagnoses: Principal Problem:   MDD (major depressive disorder), recurrent episode, severe (HCC) Active Problems:   Suicidal ideation   Polysubstance use disorder   PTSD (post-traumatic stress disorder)   Current Medications:  Current Facility-Administered Medications  Medication Dose Route Frequency Provider Last Rate Last Admin   acetaminophen (TYLENOL) tablet 650 mg  650 mg Oral Q6H PRN Bobbitt, Shalon E, NP       alum & mag hydroxide-simeth (MAALOX/MYLANTA) 200-200-20 MG/5ML suspension 30 mL  30 mL Oral Q4H PRN Bobbitt, Shalon E, NP       buprenorphine-naloxone (SUBOXONE) 8-2 mg per SL tablet 1 tablet  1 tablet Sublingual BID Lewanda Rife, MD   1 tablet at 04/24/23 0940   busPIRone (BUSPAR) tablet 5 mg  5 mg Oral BID Lewanda Rife, MD   5 mg at 04/24/23 8295   clonazePAM (KLONOPIN) tablet 0.5 mg  0.5 mg Oral TID Lewanda Rife, MD   0.5 mg at 04/24/23 6213   diphenhydrAMINE (BENADRYL) capsule 50 mg  50 mg Oral TID PRN Bobbitt, Shalon E, NP       Or   diphenhydrAMINE (BENADRYL) injection 50 mg  50 mg Intramuscular TID PRN Bobbitt, Shalon E, NP       divalproex (DEPAKOTE) DR tablet 250 mg  250 mg Oral BID Lewanda Rife, MD   250 mg at 04/24/23 0865   folic acid (FOLVITE) tablet 1 mg  1 mg Oral Daily Lewanda Rife, MD   1 mg at 04/24/23 7846   haloperidol (HALDOL) tablet 5 mg  5 mg Oral TID PRN Bobbitt, Shalon E, NP       Or   haloperidol lactate (HALDOL) injection 5 mg  5 mg Intramuscular TID PRN Bobbitt, Shalon E, NP       hydrOXYzine (ATARAX) tablet 25 mg  25 mg Oral TID PRN Bobbitt, Shalon E, NP       LORazepam (ATIVAN) tablet 0-4 mg  0-4 mg Oral Q8H Parmar, Meenakshi, MD       magnesium hydroxide (MILK OF MAGNESIA) suspension 30 mL  30 mL  Oral Daily PRN Bobbitt, Shalon E, NP       multivitamin with minerals tablet 1 tablet  1 tablet Oral Daily Lewanda Rife, MD   1 tablet at 04/24/23 0938   sertraline (ZOLOFT) tablet 50 mg  50 mg Oral Daily Lewanda Rife, MD   50 mg at 04/24/23 0940   thiamine (VITAMIN B1) tablet 100 mg  100 mg Oral Daily Lewanda Rife, MD   100 mg at 04/24/23 0800   Or   thiamine (VITAMIN B1) injection 100 mg  100 mg Intravenous Daily Lewanda Rife, MD       traZODone (DESYREL) tablet 50 mg  50 mg Oral QHS PRN Bobbitt, Shalon E, NP   50 mg at 04/23/23 2116   PTA Medications: Medications Prior to Admission  Medication Sig Dispense Refill Last Dose   albuterol (PROVENTIL HFA;VENTOLIN HFA) 108 (90 Base) MCG/ACT inhaler Inhale 2 puffs into the lungs every 6 (six) hours as needed for wheezing or shortness of breath.       ALPRAZolam (XANAX) 1 MG tablet Take 1 mg by mouth 4 (four) times daily.  3    amphetamine-dextroamphetamine (ADDERALL) 15 MG tablet Take 1 tablet by mouth 2 (two) times daily.  gabapentin (NEURONTIN) 300 MG capsule TAKE 1 CAPSULE(300 MG) BY MOUTH THREE TIMES DAILY (Patient not taking: Reported on 04/21/2023) 90 capsule 3    hydrOXYzine (ATARAX/VISTARIL) 25 MG tablet Take 25 mg by mouth as needed. (Patient not taking: Reported on 04/21/2023)      ibuprofen (ADVIL,MOTRIN) 800 MG tablet Take 1 tablet (800 mg total) by mouth every 8 (eight) hours as needed. (Patient not taking: Reported on 01/24/2019) 60 tablet 1    ipratropium (ATROVENT) 0.06 % nasal spray Place 1 spray into the nose as needed. (Patient not taking: Reported on 04/21/2023)      medroxyPROGESTERone (DEPO-PROVERA) 150 MG/ML injection ADMINISTER 1 ML(150 MG) IN THE MUSCLE EVERY 3 MONTHS 1 mL 0    QUEtiapine (SEROQUEL) 200 MG tablet Take 200 mg by mouth at bedtime. (Patient not taking: Reported on 04/21/2023)      sertraline (ZOLOFT) 100 MG tablet Take 200 mg by mouth daily.      SUBOXONE 8-2 MG FILM Take 1 Film by mouth 3  (three) times daily.       Patient Stressors: Marital or family conflict   Substance abuse    Patient Strengths: Ability for insight  Motivation for treatment/growth   Treatment Modalities: Medication Management, Group therapy, Case management,  1 to 1 session with clinician, Psychoeducation, Recreational therapy.   Physician Treatment Plan for Primary Diagnosis: MDD (major depressive disorder), recurrent episode, severe (HCC) Long Term Goal(s): Improvement in symptoms so as ready for discharge   Short Term Goals: Ability to identify changes in lifestyle to reduce recurrence of condition will improve Ability to verbalize feelings will improve Ability to disclose and discuss suicidal ideas Ability to demonstrate self-control will improve Ability to identify and develop effective coping behaviors will improve Ability to maintain clinical measurements within normal limits will improve Ability to identify triggers associated with substance abuse/mental health issues will improve  Medication Management: Evaluate patient's response, side effects, and tolerance of medication regimen.  Therapeutic Interventions: 1 to 1 sessions, Unit Group sessions and Medication administration.  Evaluation of Outcomes: Not Met  Physician Treatment Plan for Secondary Diagnosis: Principal Problem:   MDD (major depressive disorder), recurrent episode, severe (HCC) Active Problems:   Suicidal ideation   Polysubstance use disorder   PTSD (post-traumatic stress disorder)  Long Term Goal(s): Improvement in symptoms so as ready for discharge   Short Term Goals: Ability to identify changes in lifestyle to reduce recurrence of condition will improve Ability to verbalize feelings will improve Ability to disclose and discuss suicidal ideas Ability to demonstrate self-control will improve Ability to identify and develop effective coping behaviors will improve Ability to maintain clinical measurements within  normal limits will improve Ability to identify triggers associated with substance abuse/mental health issues will improve     Medication Management: Evaluate patient's response, side effects, and tolerance of medication regimen.  Therapeutic Interventions: 1 to 1 sessions, Unit Group sessions and Medication administration.  Evaluation of Outcomes: Not Met   RN Treatment Plan for Primary Diagnosis: MDD (major depressive disorder), recurrent episode, severe (HCC) Long Term Goal(s): Knowledge of disease and therapeutic regimen to maintain health will improve  Short Term Goals: Ability to verbalize frustration and anger appropriately will improve, Ability to demonstrate self-control, Ability to participate in decision making will improve, Ability to verbalize feelings will improve, Ability to disclose and discuss suicidal ideas, Ability to identify and develop effective coping behaviors will improve, and Compliance with prescribed medications will improve  Medication Management: RN will administer medications  as ordered by provider, will assess and evaluate patient's response and provide education to patient for prescribed medication. RN will report any adverse and/or side effects to prescribing provider.  Therapeutic Interventions: 1 on 1 counseling sessions, Psychoeducation, Medication administration, Evaluate responses to treatment, Monitor vital signs and CBGs as ordered, Perform/monitor CIWA, COWS, AIMS and Fall Risk screenings as ordered, Perform wound care treatments as ordered.  Evaluation of Outcomes: Not Met   LCSW Treatment Plan for Primary Diagnosis: MDD (major depressive disorder), recurrent episode, severe (HCC) Long Term Goal(s): Safe transition to appropriate next level of care at discharge, Engage patient in therapeutic group addressing interpersonal concerns.  Short Term Goals: Engage patient in aftercare planning with referrals and resources, Increase social support, Increase  ability to appropriately verbalize feelings, Increase emotional regulation, Facilitate acceptance of mental health diagnosis and concerns, and Increase skills for wellness and recovery  Therapeutic Interventions: Assess for all discharge needs, 1 to 1 time with Social worker, Explore available resources and support systems, Assess for adequacy in community support network, Educate family and significant other(s) on suicide prevention, Complete Psychosocial Assessment, Interpersonal group therapy.  Evaluation of Outcomes: Not Met   Progress in Treatment: Attending groups: No. Participating in groups: No. Taking medication as prescribed: Yes. Toleration medication: Yes. Family/Significant other contact made: No, will contact:  once permission is given. Patient understands diagnosis: Yes. Discussing patient identified problems/goals with staff: Yes. Medical problems stabilized or resolved: Yes. Denies suicidal/homicidal ideation: Yes. Issues/concerns per patient self-inventory: No. Other: none  New problem(s) identified: No, Describe:  none  New Short Term/Long Term Goal(s): detox, elimination of symptoms of psychosis, medication management for mood stabilization; elimination of SI thoughts; development of comprehensive mental wellness/sobriety plan.   Patient Goals:  "I want to go towards [place]"  Discharge Plan or Barriers: CSW to assist patient in development of appropriate discharge plans.    Reason for Continuation of Hospitalization: Anxiety Depression Medical Issues Medication stabilization Suicidal ideation    Estimated Length of Stay:  1-7 days  Last 3 [place] Suicide Severity Risk Score: Flowsheet Row Admission (Current) from [facility]/22/2023 in [facility] INPATIENT BE[facility]AL MEDICINE ED from 04/21/2023 in [facility] Emergency Department at [facility]  C-SSRS RISK CATEGORY Low Risk No Risk       Last PHQ 2/9 Scores:    02/06/2018   10:39 AM  Depression screen PHQ 2/9   Decreased Interest   Down, Depressed, Hopeless   PHQ - 2 Score   Altered sleeping   Tired, decreased energy   Change in appetite   Feeling bad or failure about yourself    Trouble concentrating   Moving slowly or fidgety/restless   Suicidal thoughts   PHQ-9 Score   Difficult doing work/chores      Information is confidential and restricted. Go to Review Flowsheets to unlock data.    Scribe for Treatment Team: Harden Mo, [place] Mt 04/24/2023 2:25 PM

## 2023-04-24 NOTE — Group Note (Signed)
Date:  04/24/2023 Time:  6:17 PM  Group Topic/Focus:  OUTDOOR RECREATION STRUCTURED ACTIVITY GROUP AND RAPPORT BUILDING.    Participation Level:  Active  Participation Quality:  Appropriate, Attentive, Sharing, and Supportive  Affect:  Appropriate and Excited  Cognitive:  Alert, Appropriate, and Oriented  Insight: Appropriate  Engagement in Group:  Developing/Improving, Engaged, and Supportive  Modes of Intervention:  Activity, Discussion, Rapport Building, and Socialization  Additional Comments:    Kirsten Wang 04/24/2023, 6:17 PM

## 2023-04-24 NOTE — Progress Notes (Signed)
[facility] MD Progress Note  04/24/2023 10:47 AM Kirsten Wang  MRN:  [id number]  Subjective:  Pt chart reviewed, discussed with interdisciplinary team, and seen on rounds. Reports euthymic, "pretty good" mood. Denies suicidal, homicidal ideations. Denies auditory visual hallucinations or paranoia. Denies past history of suicide attempt or non suicidal self injurious behavior. Reports good appetite. Reports poor sleep, which she attributes to being in the hospital. She states trazodone has been helpful although she continues to have poor sleep. We discussed increasing trazodone which she declined. She is hoping to discharge by Wednesday. She denies any side effects of medications.  Principal Problem: MDD (major depressive disorder), recurrent episode, severe (HCC) Diagnosis: Principal Problem:   MDD (major depressive disorder), recurrent episode, severe (HCC) Active Problems:   Suicidal ideation   Polysubstance use disorder   PTSD (post-traumatic stress disorder)  Total Time spent with patient:  25 minutes  Past Psychiatric History: Pt reports past history of depression, anxiety, opiate use disorder, in full remission maintained on suboxone  Past Medical History:  Past Medical History:  Diagnosis Date   Anxiety    Asthma    Atresia and stenosis of large intestine, rectum, and anal canal, congenital 1993   GERD (gastroesophageal reflux disease)    HEARTBURN WITH PREGNANCY   Vaginal Pap smear, abnormal     Past Surgical History:  Procedure Laterality Date   ABDOMINAL SURGERY     CESAREAN SECTION  06/14/2016   twins   CESAREAN SECTION N/A 11/05/2017   Procedure: CESAREAN SECTION;  Surgeon: Linzie Collin, MD;  Location: [facility] ORS;  Service: Obstetrics;  Laterality: N/A;   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  She reports her 64-year-old has autism Social History:  Social History   Substance and Sexual Activity  Alcohol Use Not Currently     Social History   Substance and Sexual Activity  Drug Use Not Currently   Types: Marijuana    Social History   Socioeconomic History   Marital status: Single    Spouse name: Not on file   Number of children: Not on file   Years of education: Not on file   Highest education level: Not on file  Occupational History   Not on file  Tobacco Use   Smoking status: Former    Current packs/day: 0.00    Average packs/day: 1 pack/day for 11.7 years (11.7 ttl pk-yrs)    Types: Cigarettes    Start date: 09/05/2006    Quit date: 05/06/2018    Years since quitting: 4.9   Smokeless tobacco: Never  Vaping Use   Vaping status: Every Day   Start date: 05/06/2018  Substance and Sexual Activity   Alcohol use: Not Currently   Drug use: Not Currently    Types: Marijuana   Sexual activity: Yes    Birth control/protection: Injection  Other Topics Concern   Not on file  Social History Narrative   Not on file   Social Determinants of Health   Financial Resource Strain: Not on file  Food Insecurity: No Food Insecurity (04/22/2023)   Hunger Vital Sign  Worried About Programme researcher, broadcasting/film/video in the Last Year: Never true    Ran Out of Food in the Last Year: Never true  Transportation Needs: No Transportation Needs (04/22/2023)   PRAPARE - Administrator, Civil Service (Medical): No    Lack of Transportation (Non-Medical): No  Physical Activity: Not on file  Stress: Not on file  Social Connections: Not on file   Additional Social History:                         Sleep: Poor  Appetite:  Good  Current Medications: Current Facility-Administered Medications  Medication Dose Route Frequency  Provider Last Rate Last Admin   acetaminophen (TYLENOL) tablet 650 mg  650 mg Oral Q6H PRN Bobbitt, Shalon E, NP       alum & mag hydroxide-simeth (MAALOX/MYLANTA) 200-200-20 MG/5ML suspension 30 mL  30 mL Oral Q4H PRN Bobbitt, Shalon E, NP       buprenorphine-naloxone (SUBOXONE) 8-2 mg per SL tablet 1 tablet  1 tablet Sublingual BID Lewanda Rife, MD   1 tablet at 04/24/23 0940   busPIRone (BUSPAR) tablet 5 mg  5 mg Oral BID Lewanda Rife, MD   5 mg at 04/24/23 8119   clonazePAM (KLONOPIN) tablet 0.5 mg  0.5 mg Oral TID Lewanda Rife, MD   0.5 mg at 04/24/23 1478   diphenhydrAMINE (BENADRYL) capsule 50 mg  50 mg Oral TID PRN Bobbitt, Shalon E, NP       Or   diphenhydrAMINE (BENADRYL) injection 50 mg  50 mg Intramuscular TID PRN Bobbitt, Shalon E, NP       divalproex (DEPAKOTE) DR tablet 250 mg  250 mg Oral BID Lewanda Rife, MD   250 mg at 04/24/23 2956   folic acid (FOLVITE) tablet 1 mg  1 mg Oral Daily Lewanda Rife, MD   1 mg at 04/24/23 2130   haloperidol (HALDOL) tablet 5 mg  5 mg Oral TID PRN Bobbitt, Shalon E, NP       Or   haloperidol lactate (HALDOL) injection 5 mg  5 mg Intramuscular TID PRN Bobbitt, Shalon E, NP       hydrOXYzine (ATARAX) tablet 25 mg  25 mg Oral TID PRN Bobbitt, Shalon E, NP       LORazepam (ATIVAN) tablet 0-4 mg  0-4 mg Oral Q8H Parmar, Meenakshi, MD       magnesium hydroxide (MILK OF MAGNESIA) suspension 30 mL  30 mL Oral Daily PRN Bobbitt, Shalon E, NP       multivitamin with minerals tablet 1 tablet  1 tablet Oral Daily Lewanda Rife, MD   1 tablet at 04/24/23 0938   sertraline (ZOLOFT) tablet 50 mg  50 mg Oral Daily Lewanda Rife, MD   50 mg at 04/24/23 0940   thiamine (VITAMIN B1) tablet 100 mg  100 mg Oral Daily Lewanda Rife, MD   100 mg at 04/23/23 8657   Or   thiamine (VITAMIN B1) injection 100 mg  100 mg Intravenous Daily Lewanda Rife, MD       traZODone (DESYREL) tablet 50 mg  50 mg Oral QHS PRN Bobbitt, Shalon E, NP    50 mg at 04/23/23 2116    Lab Results: No results found for this or any previous visit (from the past 48 hour(s)).  Blood Alcohol level:  Lab Results  Component Value Date   ETH <10 04/22/2023   ETH <10 04/21/2023    Metabolic  Disorder Labs: No results found for: "HGBA1C", "MPG" No results found for: "PROLACTIN" No results found for: "CHOL", "TRIG", "HDL", "CHOLHDL", "VLDL", "LDLCALC"  Physical Findings: AIMS:  , ,  ,  ,    CIWA:  CIWA-Ar Total: 0 COWS:     Musculoskeletal: Strength & Muscle Tone: within normal limits Gait & Station: normal Patient leans: N/A  Psychiatric Specialty Exam:  Presentation  General Appearance:  Appropriate for Environment  Eye Contact: Good  Speech: Clear and Coherent; Normal Rate  Speech Volume: Normal  Handedness: Right   Mood and Affect  Mood: Euthymic ("pretty good")  Affect: Full Range   Thought Process  Thought Processes: Coherent; Goal Directed; Linear  Descriptions of Associations:Intact  Orientation:Full (Time, Place and Person)  Thought Content:Logical  History of Schizophrenia/Schizoaffective disorder:No  Duration of Psychotic Symptoms:No data recorded Hallucinations:Hallucinations: None  Ideas of Reference:None  Suicidal Thoughts:Suicidal Thoughts: No  Homicidal Thoughts:Homicidal Thoughts: No   Sensorium  Memory: Immediate Good; Recent Good; Remote Good  Judgment: Intact  Insight: Present   Executive Functions  Concentration: Fair  Attention Span: Fair  Recall: [employer] of Knowledge: Fair  Language: Fair   Psychomotor Activity  Psychomotor Activity: Psychomotor Activity: Normal   Assets  Assets: Communication Skills; Desire for Improvement; Financial Resources/Insurance; Housing; Resilience   Sleep  Sleep: Sleep: Poor    Physical Exam: Physical Exam Constitutional:      General: She is not in acute distress.    Appearance: She is not ill-appearing,  toxic-appearing or diaphoretic.  Eyes:     General: No scleral icterus. Cardiovascular:     Rate and Rhythm: Normal rate.  Pulmonary:     Effort: Pulmonary effort is normal. No respiratory distress.  Neurological:     Mental Status: She is alert and oriented to person, place, and time.  Psychiatric:        Attention and Perception: Attention and perception normal.        Mood and Affect: Mood and affect normal.        Speech: Speech normal.        Behavior: Behavior normal. Behavior is cooperative.        Thought Content: Thought content normal.        Cognition and Memory: Cognition and memory normal.        Judgment: Judgment normal.    Review of Systems  Constitutional:  Negative for chills and fever.  Respiratory:  Negative for shortness of breath.   Cardiovascular:  Negative for chest pain and palpitations.  Gastrointestinal:  Negative for abdominal pain.  Neurological:  Negative for headaches.  Psychiatric/Behavioral:  Negative for depression, hallucinations, memory loss and suicidal ideas. The patient is not nervous/anxious and does not have insomnia.    Blood pressure 103/81, pulse 83, temperature 98.4 F (36.9 C), temperature source Oral, resp. rate 19, height 5\' 1"  (1.549 m), weight 62.6 kg, last menstrual period 04/14/2023, SpO2 100%. Body mass index is 26.07 kg/m.  Treatment Plan Summary: Daily contact with patient to assess and evaluate symptoms and progress in treatment, Medication management, and Plan    Patient is admitted to locked unit under safety precautions Continue on Suboxone 8-2 mg per sublingual tablet twice daily Discontinued Xanax and started patient on Klonopin 0.5 mg by mouth 3 times daily to avoid withdrawals Continue on CIWA protocol Continue on Zoloft 50 mg by mouth daily Continue on BuSpar 5 mg by mouth twice daily to help with anxiety Patient was encouraged to attend group and  work on coping strategies Started Depakote 250 mg by mouth twice  daily for mood stabilization Social worker consulted to get collateral and help with a safe discharge plan   Lauree Chandler, NP 04/24/2023, 10:47 AM

## 2023-04-24 NOTE — Progress Notes (Signed)
Patient alert and oriented x 4, thoughts are organized and coherent, her  affect is flat but brightens upon approach, she denies SI/HI/AVH. Patient is  noted interacting appropriately with peers in the milieu, she appears less anxious, thoughts are organized, speech is soft non pressured. Patient is on CIWA she was noted less anxious and was medicated per CIWA protocol. 15 minutes safety checks maintained will continue to maintain

## 2023-04-24 NOTE — Group Note (Signed)
Recreation Therapy Group Note   Group Topic:Problem Solving  Group Date: 04/24/2023 Start Time: 1000 End Time: 1055 Facilitators: Rosina Lowenstein, LRT, CTRS Location:  Craft Room  Group Description: Life Boat. Patients were given the scenario that they are on a boat that is about to become shipwrecked, leaving them stranded on an [place]. They are asked to make a list of 15 different items that they want to take with them when they are stranded on the [place]. Patients are asked to rank their items from most important to least important, #1 being the most important and #15 being the least. Patients will work individually for the first round to come up with 15 items and then pair up with a peer(s) to condense their list and come up with one list of 15 items between the two of them. Patients or LRT will read aloud the 15 different items to the group after each round. LRT facilitated post-activity processing to discuss how this activity can be used in daily life post discharge.   Goal Area(s) Addressed:  Patient will identify priorities, wants and needs. Patient will communicate with LRT and peers. Patient will work collectively as a Administrator, Civil Service. Patient will work on Product manager.    Affect/Mood: N/A   Participation Level: Did not attend    Clinical Observations/Individualized Feedback: Kirsten Wang did not attend group.  Plan: Continue to engage patient in RT group sessions 2-3x/week.   Rosina Lowenstein, LRT, CTRS 04/24/2023 11:24 AM

## 2023-04-24 NOTE — Group Note (Signed)
Date:  04/24/2023 Time:  10:46 AM  Group Topic/Focus:  Self Care:   The focus of this group is to help patients understand the importance of self-care in order to improve or restore emotional, physical, spiritual, interpersonal, and financial health.    Participation Level:  Did Not Attend   Kirsten Wang 04/24/2023, 10:46 AM

## 2023-04-24 NOTE — Plan of Care (Signed)
  Problem: Education: Goal: Knowledge of General Education information will improve Description Including pain rating scale, medication(s)/side effects and non-pharmacologic comfort measures Outcome: Progressing   

## 2023-04-24 NOTE — Plan of Care (Signed)
D- Patient alert and oriented. Affect is pleasant pr is cooperative Denies SI, HI, AVH, and pain. Marland KitchenA- Scheduled medications administered to patient, per MD orders. Support and encouragement provided.  Routine safety checks conducted every 15 minutes.  Patient informed to notify staff with problems or concerns. R- No adverse drug reactions noted. Patient contracts for safety at this time. Patient compliant with medications and treatment plan. Patient receptive, calm, and cooperative. Patient interacts well with others on the unit.  Patient remains safe at this time.

## 2023-04-24 NOTE — Group Note (Signed)
Date:  04/24/2023 Time:  9:02 PM  Group Topic/Focus:  Making Healthy Choices:   The focus of this group is to help patients identify negative/unhealthy choices they were using prior to admission and identify positive/healthier coping strategies to replace them upon discharge.    Participation Level:  Active  Participation Quality:  Appropriate  Affect:  Appropriate  Cognitive:  Appropriate  Insight: Appropriate  Engagement in Group:  Engaged  Modes of Intervention:  Activity  Additional Comments:    Lenore Cordia 04/24/2023, 9:02 PM

## 2023-04-25 DIAGNOSIS — F332 Major depressive disorder, recurrent severe without psychotic features: Secondary | ICD-10-CM | POA: Diagnosis not present

## 2023-04-25 MED ORDER — TRAZODONE HCL 50 MG PO TABS: 50.0000 mg | ORAL_TABLET | Freq: Every evening | ORAL | 0 refills | Status: AC | PRN

## 2023-04-25 MED ORDER — SERTRALINE HCL 50 MG PO TABS: 50.0000 mg | ORAL_TABLET | Freq: Every day | ORAL | 0 refills | Status: AC

## 2023-04-25 MED ORDER — BUPRENORPHINE HCL-NALOXONE HCL 8-2 MG SL SUBL: 1.0000 | SUBLINGUAL_TABLET | Freq: Two times a day (BID) | SUBLINGUAL | Status: AC

## 2023-04-25 MED ORDER — DIVALPROEX SODIUM 250 MG PO DR TAB: 250.0000 mg | DELAYED_RELEASE_TABLET | Freq: Two times a day (BID) | ORAL | 0 refills | Status: AC

## 2023-04-25 MED ORDER — CLONAZEPAM 0.5 MG PO TABS: 0.5000 mg | ORAL_TABLET | Freq: Three times a day (TID) | ORAL | 0 refills | Status: AC

## 2023-04-25 MED ORDER — BUSPIRONE HCL 5 MG PO TABS: 5.0000 mg | ORAL_TABLET | Freq: Two times a day (BID) | ORAL | 0 refills | Status: AC

## 2023-04-25 NOTE — Progress Notes (Signed)
Patient ID: Kirsten Wang, female   DOB: 05/02/1991, 32 y.o.   MRN: [id number]  Discharge Note:  Patient denies SI/HI/AVH at this time. Discharge instructions, AVS, prescriptions, and transition record gone over with patient. Patient received a copy of her Suicide Safety Plan. Patient agrees to comply with medication management, follow-up visit, and outpatient therapy. Patient belongings returned to patient. Patient questions and concerns addressed and answered. Patient ambulatory off unit. Patient discharged to home with her cousin.
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
## 2023-04-25 NOTE — Group Note (Signed)
Date:  04/25/2023 Time:  11:11 AM  Group Topic/Focus:  Goals Group:   The focus of this group is to help patients establish daily goals to achieve during treatment and discuss how the patient can incorporate goal setting into their daily lives to aide in recovery.    Participation Level:  Active  Participation Quality:  Appropriate and Attentive  Affect:  Appropriate  Cognitive:  Alert, Appropriate, and Oriented  Insight: Appropriate  Engagement in Group:  Developing/Improving and Engaged  Modes of Intervention:  Activity and Discussion  Additional Comments:    Arnett Duddy 04/25/2023, 11:11 AM

## 2023-04-25 NOTE — Group Note (Signed)
Recreation Therapy Group Note   Group Topic:General Recreation  Group Date: 04/25/2023 Start Time: 1000 End Time: 1100 Facilitators: Rosina Lowenstein, LRT, CTRS Location:  Courtyard  Group Description: Outdoor Recreation. Patients had the option to play basketball, corn hole or sit and listen to music while outside in the courtyard getting fresh air and sunlight. LRT and pts discussed things that they enjoy doing in their free time outside of the hospital.   Goal Area(s) Addressed: Patient will identify leisure interests.  Patient will practice healthy decision making. Patient will engage in recreation activity.   Affect/Mood: N/A   Participation Level: Did not attend    Clinical Observations/Individualized Feedback: Jennine did not attend group.  Plan: Continue to engage patient in RT group sessions 2-3x/week.   Rosina Lowenstein, LRT, CTRS 04/25/2023 11:23 AM
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
## 2023-04-25 NOTE — Plan of Care (Signed)
  Problem: Education: Goal: Knowledge of General Education information will improve Description: Including pain rating scale, medication(s)/side effects and non-pharmacologic comfort measures Outcome: Adequate for Discharge   Problem: Health Behavior/Discharge Planning: Goal: Ability to manage health-related needs will improve Outcome: Adequate for Discharge   Problem: Clinical Measurements: Goal: Ability to maintain clinical measurements within normal limits will improve Outcome: Adequate for Discharge Goal: Will remain free from infection Outcome: Adequate for Discharge Goal: Diagnostic test results will improve Outcome: Adequate for Discharge Goal: Respiratory complications will improve Outcome: Adequate for Discharge Goal: Cardiovascular complication will be avoided Outcome: Adequate for Discharge   Problem: Activity: Goal: Risk for activity intolerance will decrease Outcome: Adequate for Discharge   Problem: Nutrition: Goal: Adequate nutrition will be maintained Outcome: Adequate for Discharge   Problem: Coping: Goal: Level of anxiety will decrease Outcome: Adequate for Discharge   Problem: Elimination: Goal: Will not experience complications related to bowel motility Outcome: Adequate for Discharge Goal: Will not experience complications related to urinary retention Outcome: Adequate for Discharge   Problem: Pain Managment: Goal: General experience of comfort will improve Outcome: Adequate for Discharge   Problem: Safety: Goal: Ability to remain free from injury will improve Outcome: Adequate for Discharge   Problem: Skin Integrity: Goal: Risk for impaired skin integrity will decrease Outcome: Adequate for Discharge   Problem: Education: Goal: Knowledge of [place] [facility] Education information/materials will improve Outcome: Adequate for Discharge Goal: Emotional status will improve Outcome: Adequate for Discharge Goal: Mental status will  improve Outcome: Adequate for Discharge Goal: Verbalization of understanding the information provided will improve Outcome: Adequate for Discharge   Problem: Activity: Goal: Interest or engagement in activities will improve Outcome: Adequate for Discharge Goal: Sleeping patterns will improve Outcome: Adequate for Discharge   Problem: Coping: Goal: Ability to verbalize frustrations and anger appropriately will improve Outcome: Adequate for Discharge Goal: Ability to demonstrate self-control will improve Outcome: Adequate for Discharge   Problem: Health Behavior/Discharge Planning: Goal: Identification of resources available to assist in meeting health care needs will improve Outcome: Adequate for Discharge Goal: Compliance with treatment plan for underlying cause of condition will improve Outcome: Adequate for Discharge   Problem: Physical Regulation: Goal: Ability to maintain clinical measurements within normal limits will improve Outcome: Adequate for Discharge   Problem: Safety: Goal: Periods of time without injury will increase Outcome: Adequate for Discharge   

## 2023-04-25 NOTE — [facility] Suicide Risk Assessment (Signed)
[facility] INPATIENT:  Family/Significant Other Suicide Prevention Education  Suicide Prevention Education:  Patient Refusal for Family/Significant Other Suicide Prevention Education: The patient Kirsten Wang has refused to provide written consent for family/significant other to be provided Family/Significant Other Suicide Prevention Education during admission and/or prior to discharge.  Physician notified.  Elza Rafter 04/25/2023, 9:31 AM

## 2023-04-25 NOTE — Progress Notes (Signed)
D- Patient alert and oriented. Patient presents in a pleasant mood on assessment reporting that she slept poor last night and had no complaints to voice to this Clinical research associate. Patient stated that she didn't go to bed until around midnight. Patient endorsed slight depression and anxiety, stating "the situation going on, it's not much depression, it's just I don't like it", is why she's feeling this way. Patient denies SI, HI, AVH, and pain at this time. Patient's goal for today is to "keep striving towards my goals", in which she "made a list of things that need to be done to reorganize/prioritize my life", in order to achieve her goal.  A- Scheduled medications administered to patient, per MD orders. Support and encouragement provided. Routine safety checks conducted every 15 minutes. Patient informed to notify staff with problems or concerns.  R- No adverse drug reactions noted. Patient contracts for safety at this time. Patient compliant with medications and treatment plan. Patient receptive, calm, and cooperative. Patient interacts well with others on the unit. Patient remains safe at this time.

## 2023-04-25 NOTE — Progress Notes (Signed)
  [facility] Adult Case Management Discharge Plan :  Will you be returning to the same living situation after discharge:  Yes,  pt will be returning to her cousin's home  At discharge, do you have transportation home?: Yes,  pt states her cousin will pick her up  Do you have the ability to pay for your medications: Yes,  [place] MEDICAID PREPAID HEALTH PLAN / [place] MEDICAID [employer] [place]  Release of information consent forms completed and in the chart;  Patient's signature needed at discharge.  Patient to Follow up at:  Follow-up Information     [facility] [place]. Go on 05/15/2023.   Why: Your next appointment with [facility] is scheduled for September 9th,  at 9:40 AM, please remember to bring your insurance card Contact information: [address] [place] [address] [phone]                 Next level of care provider has access to [facility] Link:no  Safety Planning and Suicide Prevention discussed: No. Pt declined     Has patient been referred to the Quitline?: Patient refused referral for treatment  Patient has been referred for addiction treatment: No known substance use disorder.  [address] LCSWA 04/25/2023, 9:29 AM
